# Patient Record
Sex: Female | Born: 1965 | Race: White | Hispanic: No | Smoking: Never smoker
Health system: Southern US, Community
[De-identification: ages and names within clinical notes are randomized; demographics above are authoritative.]

## PROBLEM LIST (undated history)

## (undated) DIAGNOSIS — J029 Acute pharyngitis, unspecified: Secondary | ICD-10-CM

## (undated) DIAGNOSIS — R040 Epistaxis: Secondary | ICD-10-CM

## (undated) DIAGNOSIS — R42 Dizziness and giddiness: Secondary | ICD-10-CM

## (undated) DIAGNOSIS — Z460 Encounter for fitting and adjustment of spectacles and contact lenses: Secondary | ICD-10-CM

## (undated) DIAGNOSIS — R6883 Chills (without fever): Secondary | ICD-10-CM

## (undated) DIAGNOSIS — R531 Weakness: Secondary | ICD-10-CM

## (undated) DIAGNOSIS — J349 Unspecified disorder of nose and nasal sinuses: Secondary | ICD-10-CM

## (undated) DIAGNOSIS — L309 Dermatitis, unspecified: Secondary | ICD-10-CM

## (undated) HISTORY — DX: Encounter for fitting and adjustment of spectacles and contact lenses: Z46.0

## (undated) HISTORY — PX: TOTAL THYROIDECTOMY: SHX2547

## (undated) HISTORY — DX: Weakness: R53.1

## (undated) HISTORY — DX: Acute pharyngitis, unspecified: J02.9

## (undated) HISTORY — DX: Chills (without fever): R68.83

## (undated) HISTORY — PX: FOOT SURGERY: SHX648

## (undated) HISTORY — DX: Dizziness and giddiness: R42

## (undated) HISTORY — DX: Unspecified disorder of nose and nasal sinuses: J34.9

## (undated) HISTORY — DX: Epistaxis: R04.0

## (undated) HISTORY — PX: TONSILLECTOMY AND ADENOIDECTOMY: SUR1326

## (undated) HISTORY — DX: Dermatitis, unspecified: L30.9

---

## 1999-09-29 ENCOUNTER — Encounter: Admission: RE | Admit: 1999-09-29 | Discharge: 1999-10-16 | Payer: Self-pay | Admitting: Family Medicine

## 2000-03-12 ENCOUNTER — Ambulatory Visit (HOSPITAL_BASED_OUTPATIENT_CLINIC_OR_DEPARTMENT_OTHER): Admission: RE | Admit: 2000-03-12 | Discharge: 2000-03-12 | Payer: Self-pay | Admitting: Orthopedic Surgery

## 2002-09-19 ENCOUNTER — Encounter (INDEPENDENT_AMBULATORY_CARE_PROVIDER_SITE_OTHER): Payer: Self-pay | Admitting: Specialist

## 2002-09-19 ENCOUNTER — Ambulatory Visit (HOSPITAL_BASED_OUTPATIENT_CLINIC_OR_DEPARTMENT_OTHER): Admission: RE | Admit: 2002-09-19 | Discharge: 2002-09-19 | Payer: Self-pay | Admitting: Obstetrics and Gynecology

## 2004-05-13 ENCOUNTER — Encounter: Admission: RE | Admit: 2004-05-13 | Discharge: 2004-05-13 | Payer: Self-pay | Admitting: Internal Medicine

## 2004-09-18 ENCOUNTER — Encounter: Admission: RE | Admit: 2004-09-18 | Discharge: 2004-09-18 | Payer: Self-pay | Admitting: Internal Medicine

## 2005-06-01 ENCOUNTER — Other Ambulatory Visit: Admission: RE | Admit: 2005-06-01 | Discharge: 2005-06-01 | Payer: Self-pay | Admitting: Obstetrics and Gynecology

## 2007-04-05 ENCOUNTER — Encounter: Admission: RE | Admit: 2007-04-05 | Discharge: 2007-04-05 | Payer: Self-pay | Admitting: Orthopaedic Surgery

## 2007-05-03 ENCOUNTER — Encounter: Admission: RE | Admit: 2007-05-03 | Discharge: 2007-05-03 | Payer: Self-pay | Admitting: Family Medicine

## 2007-05-03 ENCOUNTER — Encounter (INDEPENDENT_AMBULATORY_CARE_PROVIDER_SITE_OTHER): Payer: Self-pay | Admitting: Interventional Radiology

## 2007-05-03 ENCOUNTER — Other Ambulatory Visit: Admission: RE | Admit: 2007-05-03 | Discharge: 2007-05-03 | Payer: Self-pay | Admitting: Interventional Radiology

## 2008-05-03 ENCOUNTER — Inpatient Hospital Stay (HOSPITAL_COMMUNITY): Admission: AD | Admit: 2008-05-03 | Discharge: 2008-05-03 | Payer: Self-pay | Admitting: Obstetrics and Gynecology

## 2008-08-01 ENCOUNTER — Encounter: Admission: RE | Admit: 2008-08-01 | Discharge: 2008-08-01 | Payer: Self-pay | Admitting: Obstetrics and Gynecology

## 2008-08-11 ENCOUNTER — Inpatient Hospital Stay (HOSPITAL_COMMUNITY): Admission: AD | Admit: 2008-08-11 | Discharge: 2008-08-11 | Payer: Self-pay | Admitting: Obstetrics and Gynecology

## 2008-08-18 ENCOUNTER — Inpatient Hospital Stay (HOSPITAL_COMMUNITY): Admission: AD | Admit: 2008-08-18 | Discharge: 2008-08-24 | Payer: Self-pay | Admitting: Obstetrics & Gynecology

## 2008-09-01 ENCOUNTER — Inpatient Hospital Stay (HOSPITAL_COMMUNITY): Admission: AD | Admit: 2008-09-01 | Discharge: 2008-10-02 | Payer: Self-pay | Admitting: Obstetrics

## 2008-09-30 ENCOUNTER — Encounter (INDEPENDENT_AMBULATORY_CARE_PROVIDER_SITE_OTHER): Payer: Self-pay | Admitting: Obstetrics and Gynecology

## 2008-12-26 ENCOUNTER — Other Ambulatory Visit: Admission: RE | Admit: 2008-12-26 | Discharge: 2008-12-26 | Payer: Self-pay | Admitting: Interventional Radiology

## 2008-12-26 ENCOUNTER — Encounter (INDEPENDENT_AMBULATORY_CARE_PROVIDER_SITE_OTHER): Payer: Self-pay | Admitting: Interventional Radiology

## 2008-12-26 ENCOUNTER — Encounter: Admission: RE | Admit: 2008-12-26 | Discharge: 2008-12-26 | Payer: Self-pay | Admitting: Family Medicine

## 2010-01-30 ENCOUNTER — Ambulatory Visit (HOSPITAL_COMMUNITY): Admission: RE | Admit: 2010-01-30 | Discharge: 2010-01-31 | Payer: Self-pay | Admitting: Surgery

## 2010-01-30 ENCOUNTER — Encounter (INDEPENDENT_AMBULATORY_CARE_PROVIDER_SITE_OTHER): Payer: Self-pay | Admitting: Surgery

## 2010-10-05 ENCOUNTER — Encounter: Payer: Self-pay | Admitting: Internal Medicine

## 2010-12-01 LAB — CBC
HCT: 39.2 % (ref 36.0–46.0)
Hemoglobin: 13.3 g/dL (ref 12.0–15.0)
MCV: 89.5 fL (ref 78.0–100.0)
RBC: 4.38 MIL/uL (ref 3.87–5.11)
WBC: 12.6 10*3/uL — ABNORMAL HIGH (ref 4.0–10.5)

## 2010-12-01 LAB — URINALYSIS, ROUTINE W REFLEX MICROSCOPIC
Bilirubin Urine: NEGATIVE
Nitrite: NEGATIVE
Specific Gravity, Urine: 1.016 (ref 1.005–1.030)
pH: 5.5 (ref 5.0–8.0)

## 2010-12-01 LAB — URINE MICROSCOPIC-ADD ON

## 2010-12-01 LAB — BASIC METABOLIC PANEL
Chloride: 106 mEq/L (ref 96–112)
GFR calc Af Amer: >60 mL/min (ref >60)
Potassium: 4.3 mEq/L (ref 3.5–5.1)
Sodium: 144 mEq/L (ref 135–145)

## 2010-12-01 LAB — DIFFERENTIAL
Eosinophils Absolute: 0.2 10*3/uL (ref 0.0–0.7)
Eosinophils Relative: 1 % (ref 0–5)
Lymphs Abs: 5.1 10*3/uL — ABNORMAL HIGH (ref 0.7–4.0)
Monocytes Relative: 9 % (ref 3–12)

## 2010-12-01 LAB — CALCIUM
Calcium: 8.7 mg/dL (ref 8.4–10.5)
Calcium: 9 mg/dL (ref 8.4–10.5)

## 2010-12-29 LAB — GLUCOSE, CAPILLARY
Glucose-Capillary: 100 mg/dL — ABNORMAL HIGH (ref 70–99)
Glucose-Capillary: 100 mg/dL — ABNORMAL HIGH (ref 70–99)
Glucose-Capillary: 100 mg/dL — ABNORMAL HIGH (ref 70–99)
Glucose-Capillary: 104 mg/dL — ABNORMAL HIGH (ref 70–99)
Glucose-Capillary: 105 mg/dL — ABNORMAL HIGH (ref 70–99)
Glucose-Capillary: 106 mg/dL — ABNORMAL HIGH (ref 70–99)
Glucose-Capillary: 106 mg/dL — ABNORMAL HIGH (ref 70–99)
Glucose-Capillary: 109 mg/dL — ABNORMAL HIGH (ref 70–99)
Glucose-Capillary: 112 mg/dL — ABNORMAL HIGH (ref 70–99)
Glucose-Capillary: 112 mg/dL — ABNORMAL HIGH (ref 70–99)
Glucose-Capillary: 113 mg/dL — ABNORMAL HIGH (ref 70–99)
Glucose-Capillary: 121 mg/dL — ABNORMAL HIGH (ref 70–99)
Glucose-Capillary: 124 mg/dL — ABNORMAL HIGH (ref 70–99)
Glucose-Capillary: 124 mg/dL — ABNORMAL HIGH (ref 70–99)
Glucose-Capillary: 125 mg/dL — ABNORMAL HIGH (ref 70–99)
Glucose-Capillary: 131 mg/dL — ABNORMAL HIGH (ref 70–99)
Glucose-Capillary: 132 mg/dL — ABNORMAL HIGH (ref 70–99)
Glucose-Capillary: 132 mg/dL — ABNORMAL HIGH (ref 70–99)
Glucose-Capillary: 133 mg/dL — ABNORMAL HIGH (ref 70–99)
Glucose-Capillary: 134 mg/dL — ABNORMAL HIGH (ref 70–99)
Glucose-Capillary: 149 mg/dL — ABNORMAL HIGH (ref 70–99)
Glucose-Capillary: 81 mg/dL (ref 70–99)
Glucose-Capillary: 85 mg/dL (ref 70–99)
Glucose-Capillary: 86 mg/dL (ref 70–99)
Glucose-Capillary: 88 mg/dL (ref 70–99)
Glucose-Capillary: 92 mg/dL (ref 70–99)
Glucose-Capillary: 92 mg/dL (ref 70–99)
Glucose-Capillary: 92 mg/dL (ref 70–99)
Glucose-Capillary: 94 mg/dL (ref 70–99)
Glucose-Capillary: 94 mg/dL (ref 70–99)
Glucose-Capillary: 96 mg/dL (ref 70–99)
Glucose-Capillary: 97 mg/dL (ref 70–99)
Glucose-Capillary: 99 mg/dL (ref 70–99)
Glucose-Capillary: 102 mg/dL — ABNORMAL HIGH (ref 70–99)
Glucose-Capillary: 105 mg/dL — ABNORMAL HIGH (ref 70–99)
Glucose-Capillary: 93 mg/dL (ref 70–99)
Glucose-Capillary: 99 mg/dL (ref 70–99)

## 2010-12-29 LAB — CBC
HCT: 32.8 % — ABNORMAL LOW (ref 36.0–46.0)
Hemoglobin: 9 g/dL — ABNORMAL LOW (ref 12.0–15.0)
MCHC: 34.5 g/dL (ref 30.0–36.0)
MCHC: 33.4 g/dL (ref 30.0–36.0)
MCHC: 34.8 g/dL (ref 30.0–36.0)
MCV: 88.1 fL (ref 78.0–100.0)
MCV: 88.3 fL (ref 78.0–100.0)
RBC: 3.72 MIL/uL — ABNORMAL LOW (ref 3.87–5.11)
RBC: 2.91 MIL/uL — ABNORMAL LOW (ref 3.87–5.11)
RDW: 15.4 % (ref 11.5–15.5)
WBC: 9.1 10*3/uL (ref 4.0–10.5)

## 2010-12-29 LAB — TYPE AND SCREEN
ABO/RH(D): A NEG
ABO/RH(D): A NEG
Antibody Screen: POSITIVE
DAT, IgG: NEGATIVE
DAT, IgG: NEGATIVE

## 2010-12-29 LAB — DIFFERENTIAL
Eosinophils Absolute: 0.2 10*3/uL (ref 0.0–0.7)
Eosinophils Relative: 2 % (ref 0–5)
Lymphs Abs: 2.4 10*3/uL (ref 0.7–4.0)
Monocytes Absolute: 0.6 10*3/uL (ref 0.1–1.0)
Monocytes Relative: 7 % (ref 3–12)

## 2010-12-29 LAB — DIC (DISSEMINATED INTRAVASCULAR COAGULATION)PANEL
INR: 1 (ref 0.00–1.49)
Platelets: 185 10*3/uL (ref 150–400)
Prothrombin Time: 13.3 seconds (ref 11.6–15.2)

## 2011-01-27 NOTE — Discharge Summary (Signed)
[name], [name]               ACCOUNT NO.:  [id number]   MEDICAL RECORD NO.:  [id number]          PATIENT TYPE:  INP   LOCATION:  9119                          FACILITY:  [facility]   PHYSICIAN:  Maxie Better, M.D.DATE OF BIRTH:  02-Mar-1966   DATE OF ADMISSION:  09/01/2008  DATE OF DISCHARGE:  10/02/2008                               DISCHARGE SUMMARY   ADMISSION DIAGNOSES:  1. Recurrent third trimester bleeding secondary to chronic abruption.  2. Rh negative.  3. Sickle cell trait.  4. Advanced maternal age.  5. Gestational diabetes.  6. Intrauterine gestation at 59 and 4/7 weeks.   DISCHARGE DIAGNOSES:  1. Chronic abruption, recurrent third trimester vaginal bleeding,  2. Oligohydramnios.  3. Class A2 gestational diabetes.  4. Intrauterine gestation at 37-6/7 weeks.  5. Advanced maternal age.   PROCEDURE:  Primary cesarean section  Kerr hysterotomy.   HISTORY OF PRESENT ILLNESS:  This is a 45 year old gravida 4, para 1  female at 57 and 4/7 weeks' with recurrent third trimester vaginal  bleeding, readmitted on September 01, 2008, secondary to acute onset of  vaginal bleeding.  The patient has had previous prolonged  hospitalization due to her bleeding, which was thought to be  marginal/chronic abruption.  The patient had no underlying risk factor.  She developed class gestational diabetes, which ultimately required  glyburide for management.  The patient's Rh negative.  She has received  RhoGAM.   HOSPITAL COURSE:  The patient was readmitted to the hospital and she was  placed on antepartum service.  Blood sugars were monitored x4 per day.  The patient was placed on continuous monitoring.  Blood sugars were  managed with medication.  She developed premature contractions during  the hospitalization for which Procardia was given.  DIC panel was  ordered during the hospitalization as well.  The patient's examination  was 1, 50%, and -3 with some clotted blood.  She began  bleeding.  On  September 10, 2008, antiphospholipid antibody panel was done, which was  negative.  Ultrasound did not show any evidence of abruption.  Her  bleeding subsequently resolved.  The patient remained in-house.  On  September 25, 2008,  the patient requested a primary cesarean section for  the event of delivery whenever that occurs.  On September 29, 2008, the  patient began having vaginal bleeding.  She was about 1 cm long  presenting part out of pelvis, clots within the vault, fetal heart rate  of 120s,  uterine irritability.  Her last platelet count on general  attempt was 205, platelet count decreased to 186,000.  Her fibrinogen  had dropped to 1481 from a previous 659.  Given the recurrence of the  bleeding and evidence of change in her laboratory studies, decision was  made to proceed with the primary cesarean section.  Primary C-section  was performed, small scant amniotic fluid was noted.  At that time, live  female, cord around the neck x1, normal tubes and ovary, posterior  placenta with abruption, and bright red blood was noted.  Small uterine  fibroid was noted.  Placenta was sent  to Pathology.  Postoperatively,  the patient did well and desired to go home on postop day #2.  On postop  day #1, her hemoglobin was 9.0, hematocrit 25.7, platelet count 167,  000, and white count 12.6.  She remained afebrile.  Blood pressure was  normal.  Her incision had no evidence of erythema or induration.  Baby  was remained in the regular nursery.  Her blood sugars were no longer  checked.  She was deemed well to be discharged home.   DISPOSITION:  Home.   CONDITION:  Stable.   DISCHARGE MEDICATIONS:  1. Motrin 600-800 mg every 8 hours p.r.n. pain.  2. Percocet 1-2 tablets every 4-6 hours p.r.n. pain.  3. Prenatal vitamins one p.o. daily.   FOLLOWUP APPOINTMENT:  At [facility] for staple removal in the  coming week and for postpartum appointment in 6 weeks as well as a 2-  hour  glucose tolerance test in 8 weeks.   DISCHARGE INSTRUCTIONS:  Per the postpartum booklet given.      Maxie Better, M.D.  Electronically Signed     [place]/MEDQ  D:  11/04/2008  T:  11/05/2008  Job:  [id number]

## 2011-01-27 NOTE — Op Note (Signed)
[name], [name]               ACCOUNT NO.:  [id number]   MEDICAL RECORD NO.:  [id number]          PATIENT TYPE:  INP   LOCATION:  9119                          FACILITY:  [facility]   PHYSICIAN:  Maxie Better, M.D.DATE OF BIRTH:  1966/01/09   DATE OF PROCEDURE:  09/30/2008  DATE OF DISCHARGE:                               OPERATIVE REPORT   PREOPERATIVE DIAGNOSES:  Chronic placental abruption, class A2  gestational diabetes, and intrauterine gestation at 37-6/7 weeks.   POSTOPERATIVE DIAGNOSES:  Class A2 gestational diabetes, chronic  placental abruption, oligohydramnios, and intrauterine gestation at 65-  6/7 weeks.   PROCEDURES:  Primary cesarean section  Kerr hysterotomy.   ANESTHESIA:  Spinal.   SURGEON:  Maxie Better, MD   ASSISTANT:  Marlinda Mike, CNM   INDICATIONS:  A 45 year old gravida 4, para 1-0-2-1 married black female  with prenatal care complicated by class A2 gestational diabetes and  recurrent third-trimester bleeding, thought secondary to chronic  abruption, who has had prolonged hospitalization secondary to her third  episode of vaginal bleeding, who on September 30, 2008, began having  vaginal bleeding again.  The patient was betamethasone complete.  She  had a DIC panel and a CBC drawn.  Her platelets had dropped from 205 to  186, and her fibrinogen was decreased from 600s to 481.  The patient was  having some intermittent contractions, however tracing was nonreactive.  Given the findings, vaginal exam was performed; a large clot was in the  vagina, cervix was 1, long, and presenting part out of the pelvis.  Decision was made to proceed with a planned primary cesarean section.  Risks and benefits of the procedure have been explained to the patient.  Consent was signed.  The patient was transferred to the operating room.   PROCEDURE IN DETAIL:  Under adequate spinal anesthesia, the patient was  placed in the supine position with a left lateral tilt.   She was  sterilely prepped and draped in the usual fashion.  Indwelling Foley  catheter was sterilely placed.  Marcaine 0.25% was injected along the  planned Pfannenstiel skin incision site.  Pfannenstiel skin incision was  then made, carried down to the rectus fascia.  Rectus fascia was opened  transversely.  The rectus fascia was then bluntly and sharply dissected  off the rectus muscle in superior and inferior fashion.  The rectus  muscles split in midline.  The parietal peritoneum was entered sharply  and extended.  The vesicouterine peritoneum was opened transversely.  The bladder was then bluntly dissected off the lower uterine segment and  displaced with the bladder retractor inferiorly.  Curvilinear low  transverse uterine incision was then made and extended with bandage  scissors.  On entering the uterine cavity, bright red blood was  encountered with clots.  There was no amniotic fluid seen.  Subsequent  delivery of a live female was accomplished.  Cord around the neck x1 was  reducible.  Baby was bulb suctioned on the abdomen.  Cord was clamped,  cut, and the baby was transferred to the awaiting pediatrician, who  assigned Apgars of  9 and 9.  The placenta actually was posterior, it was  partially already detached, it was removed without incident, and all the  clotted material in the lower uterine segment was then removed.  Uterine  cavity was cleaned of debris.  Uterine incision had no extension, was  closed with 0 Monocryl running locked stitch, first layer, and second  layer imbricating using 0 Monocryl suture.  Small bleeding along the  peritoneal edges was cauterized.  Normal tubes and ovaries were noted  bilaterally.  Small palpable fibroid, subserosal, was noted.  There was  an intramural fibroid noted on the right.  The abdomen was then  copiously irrigated and suctioned of debris.  Good hemostasis was noted  along the incision line.  The parietal peritoneum was then  closed with 2-  0 Vicryl and the rectus fascia with 0 Vicryl x2.  The subcutaneous fat  was irrigated, small bleeders cauterized, interrupted 2-0 plain sutures  placed, and skin approximated using Ethicon staples.   SPECIMENS:  Placenta sent to Pathology.   ESTIMATED BLOOD LOSS:  700 mL.   INTRAOPERATIVE FLUID:  2500 mL.   URINE OUTPUT:  800 mL clear yellow urine.   Sponge and instrument counts x2 correct.   COMPLICATIONS:  None.   Weight of the baby was 7 pounds 8 ounces.  The patient tolerated the  procedure well and was transferred to recovery in stable condition.      Maxie Better, M.D.  Electronically Signed     [place]/MEDQ  D:  09/30/2008  T:  09/30/2008  Job:  [id number]

## 2011-01-27 NOTE — Discharge Summary (Signed)
[name], [name]               ACCOUNT NO.:  [id number]   MEDICAL RECORD NO.:  [id number]          PATIENT TYPE:  INP   LOCATION:  9151                          [facility]ACILITY:  [facility]   PHYSICIAN:  Maxie Better, M.D.DATE OF BIRTH:  01/27/1966   DATE OF ADMISSION:  08/18/2008  DATE OF DISCHARGE:  08/24/2008                               DISCHARGE SUMMARY   ADMISSION DIAGNOSES:  Third trimester vaginal bleeding, presumed  marginal abruption, class A1 gestational diabetes, intrauterine  gestation at 31-5/7th weeks, advanced maternal age, and Rh negative.   DISCHARGE DIAGNOSES:  Presumed marginal placental abruption, class A1  gestational diabetes, and intrauterine gestation at 32-4/7th weeks  undelivered.   HISTORY OF PRESENT ILLNESS:  A 45 year old gravida 4, para 1-0-2-1  female at 31-5/7th weeks admitted to [facility] for third trimester  vaginal bleeding.  The patient was unsure that she had rupture of  membranes.  She had noted some abdominal cramping.  Fetal heart rate was  positive.   HOSPITAL COURSE:  The patient was initially evaluated in Maternity  Admissions.  This is a third episode of the vaginal bleeding during this  pregnancy.  The patient underwent ultrasound that showed normal anterior  placenta.  Cervical length at 3.7.  Estimated fetal weight of 2169 g.  No evidence of an abruption.  She had been given RhoGAM at 28 weeks on  November 16.  Her group B strep status was unknown due to her  gestational age.  A nonstress test showed a fetal heart rate baseline of  130s, good reactivity and uterine irritability.  Her cervix was  fingertip, about 50% presenting part of the pelvis showed a large clot  in the vagina.  The patient had NICU consultations, betamethasone, and  monitoring of her blood sugars during her admission.  The patient had  continued daily to monitoring.  Her blood sugars did not require any  additional medications.  She had a DIC panel that showed  an increase in  her D-dimer, but otherwise had no further recurrence of her bleeding.  The patient desire to be discharged home and was discharged home on  delivery.   DISPOSITION:  Home.   CONDITION:  Stable.   DISCHARGE MEDICATIONS:  Prenatal vitamins 1 p.o. daily   DISCHARGE INSTRUCTIONS:  Preterm labor precautions, vaginal bleeding  precautions, daily kick counts, and abstain from intercourse.   FOLLOWUP APPOINTMENT:  In 1 week's time.      Maxie Better, M.D.  Electronically Signed    [place]/MEDQ  D:  09/30/2008  T:  09/30/2008  Job:  [id number]

## 2011-01-30 NOTE — Op Note (Signed)
[name], [name]                           ACCOUNT NO.:  [id number]   MEDICAL RECORD NO.:  [id number]                   PATIENT TYPE:  AMB   LOCATION:  NESC                                 FACILITY:  [facility]   PHYSICIAN:  Laqueta Linden, M.D.                 DATE OF BIRTH:  07/21/1966   DATE OF PROCEDURE:  09/19/2002  DATE OF DISCHARGE:                                 OPERATIVE REPORT   PREOPERATIVE DIAGNOSIS:  Long term breakthrough bleeding due to endometrial  polyp.   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   OPERATION PERFORMED:  Hysteroscopic resection with curettage.   SURGEON:  Laqueta Linden, M.D.   ANESTHESIA:  MAC sedation with paracervical block.   ESTIMATED BLOOD LOSS:  Less than 20 cc.   SORBITOL NET INTAKE:  0.   COMPLICATIONS:  None.   INDICATIONS FOR PROCEDURE:  The patient is a 45 year old gravida 2 para 1  female who has had persistent breakthrough bleeding since July of 2003.  She  has been alternating generic Loestrin 1.5/30 with 1/20 and taking two pills  a day for the past four months without any improvement in her bleeding.  She  was noted on pelvic examination to have an enlarged slightly irregular  uterus consistent with fibroids.  She underwent pelvic ultrasound with  sonohystogram which confirmed a fibroid uterus with normal-appearing  ovaries.  Sonohystogram revealed 9 x 4 x 10 mm endometrial polyp in the  fundus.  It was felt that the polyp was the likely source of the break  through bleeding.  She saw the informed consent film, was informed of the  risks, benefits, alternatives and complications of the procedure as well as  the increased risk of DVT due to high dose multiple pill use and agrees to  proceed.  Full consent was given.   DESCRIPTION OF PROCEDURE:  The patient was taken to the operating room and  after proper identification and consents were ascertained, she was placed on  the   operating table in supine position.  After  mask sedation was  accomplished, she was placed in the [place] stirrups.  The perineum and vagina  were prepped and draped in routine sterile fashion.  The bladder was  catheterized with a Foley throughout the procedure which was removed at the  conclusion of the procedure.  Bimanual examination confirmed an anterior  slightly enlarged irregular uterus.  A speculum was placed in the vagina.  The uterine cavity sounded to 9 cm.  The internal os was gently dilated to a  #33 Pratt dilator.  The resectoscope with continuous Sorbitol infusion was  then inserted under direct vision using the video.  The endocervical canal  was free of lesions.  The posterior fundus had a large broad based polyp.  The cornua were visualized with no other lesions noted.  The resectoscope  was placed on routine settings and the polyp was then resected in multiple  pieces which were sent separately to pathology.  The base of the polyp was  cauterized.  Hemostasis was excellent.  There were no other uterine lesions  identified.  The scope was then withdrawn.  The remainder of the endometrial  cavity was sharply curetted with a scant amount of additional tissue  obtained.  This was sent separately.  All instruments were then removed.  There was no active bleeding from the tenaculum site.  There was a slight  amount of bleeding from the cervix.  Estimated blood loss was less than 20  cc.  Sorbitol intake was 0.  The Foley catheter was removed.  The patient  received Toradol 30 mg IV 30 mg IV prior to the conclusion of the procedure.  She was awakened stable and transferred to the recovery room.  She will be  observed and discharged per anesthesia protocol.  She is to follow up at the  office in three to four weeks' time.  She is to decrease her birth control  pills to twice a day for one week, then once daily until her follow-up visit  and to use Advil as needed for any cramping.   She is to follow up in the  office as scheduled but to call sooner for excessive pain, fever, bleeding,  nausea, vomiting or any other concerns.                                                Laqueta Linden, M.D.    [facility]/MEDQ  D:  09/19/2002  T:  09/19/2002  Job:  [id number]

## 2011-01-30 NOTE — Op Note (Signed)
[place]  Patient:    [name], [name]                        MRN: [id number] Proc. Date: 03/12/00 Adm. Date:  [id number] Attending:  Aldean Baker V                           Operative Report  PREOPERATIVE DIAGNOSIS:  Hallux valgus moderate left great toe bunion.  POSTOPERATIVE DIAGNOSIS:  Hallux valgus moderate left great toe bunion.  OPERATION:  Chevron osteotomy left first metatarsal.  SURGEON:  Nadara Mustard, M.D.  ASSISTANT:  Pollyann Savoy, P.A.S.  ANESTHESIA:  Ankle block.  ESTIMATED BLOOD LOSS:  Minimal.  ANTIBIOTICS:  Kefzol 1 gm.  DRAINS:  None.  COMPLICATIONS:  None.  DISPOSITION:  To the PACU in stable condition.  INDICATION:  The patient is a 45 year old woman who has had a longstanding history of pain and callus formation over her first metatarsal head.  She has tried conservative care with anti-inflammatories and shoe modification without relief.  Her intermetatarsal angle is 11 degrees with a hallux valgus angle of 20 degrees.  She presents at this time for Chevron osteotomy.  The risks and benefits were discussed including infection, neurovascular injury, pain, fracture of the bone, stiffness of the toe.  The patient states he understands and wishes to proceed at this time.  DESCRIPTION OF PROCEDURE:  The patient is brought to outpatient room 7 after undergoing an angle block.  After adequate level of anesthesia obtained, the patients left lower extremity was prepped using DuraPrep and draped in a sterile field.  [name] was used to cover all exposed skin.  A longitudinal medial incision was made centered over the MTP joint.  This was carried down to the capsule retinaculum and an elliptical aspect of the inferior aspect of the capsule was excised to relocate the sesamoid.  An osteotomy was performed over the lateral prominence.  A Chevron osteotomy was performed.  The distal head was then translated laterally  approximately 3 to 4 mm.  This was then stabilized with a 6/2 K-wire.  The wound was irrigated with normal saline.  The retinaculum was then reapproximated using 2-0 Ethibond.  The incision was closed using an interrupted vertical mattress.  The pin was cut just above the skin and was covered in Bactroban.  The wound was covered with Adaptic, orthopedic sponges, sterile Webril, cling, and a loosely wrapped Coban.  The patient was taken to the PACU in stable condition.  She will be nonweightbearing for two weeks. Follow up in the office in two weeks.  Crutches and a wooden shoe. DD:  03/12/00 TD:  03/13/00 Job: [id number]

## 2011-06-16 LAB — URINALYSIS, ROUTINE W REFLEX MICROSCOPIC
Bilirubin Urine: NEGATIVE
Ketones, ur: NEGATIVE
Leukocytes, UA: NEGATIVE
Nitrite: NEGATIVE
Protein, ur: NEGATIVE
Urobilinogen, UA: 0.2
pH: 5.5

## 2011-06-16 LAB — URINE MICROSCOPIC-ADD ON

## 2011-06-19 LAB — TYPE AND SCREEN
ABO/RH(D): A NEG
ABO/RH(D): A NEG
DAT, IgG: NEGATIVE
DAT, IgG: NEGATIVE
DAT, IgG: NEGATIVE
DAT, IgG: NEGATIVE
DAT, IgG: NEGATIVE

## 2011-06-19 LAB — GLUCOSE, CAPILLARY
Glucose-Capillary: 102 mg/dL — ABNORMAL HIGH (ref 70–99)
Glucose-Capillary: 102 mg/dL — ABNORMAL HIGH (ref 70–99)
Glucose-Capillary: 104 mg/dL — ABNORMAL HIGH (ref 70–99)
Glucose-Capillary: 107 mg/dL — ABNORMAL HIGH (ref 70–99)
Glucose-Capillary: 108 mg/dL — ABNORMAL HIGH (ref 70–99)
Glucose-Capillary: 111 mg/dL — ABNORMAL HIGH (ref 70–99)
Glucose-Capillary: 111 mg/dL — ABNORMAL HIGH (ref 70–99)
Glucose-Capillary: 112 mg/dL — ABNORMAL HIGH (ref 70–99)
Glucose-Capillary: 113 mg/dL — ABNORMAL HIGH (ref 70–99)
Glucose-Capillary: 114 mg/dL — ABNORMAL HIGH (ref 70–99)
Glucose-Capillary: 115 mg/dL — ABNORMAL HIGH (ref 70–99)
Glucose-Capillary: 115 mg/dL — ABNORMAL HIGH (ref 70–99)
Glucose-Capillary: 115 mg/dL — ABNORMAL HIGH (ref 70–99)
Glucose-Capillary: 117 mg/dL — ABNORMAL HIGH (ref 70–99)
Glucose-Capillary: 120 mg/dL — ABNORMAL HIGH (ref 70–99)
Glucose-Capillary: 120 mg/dL — ABNORMAL HIGH (ref 70–99)
Glucose-Capillary: 121 mg/dL — ABNORMAL HIGH (ref 70–99)
Glucose-Capillary: 121 mg/dL — ABNORMAL HIGH (ref 70–99)
Glucose-Capillary: 122 mg/dL — ABNORMAL HIGH (ref 70–99)
Glucose-Capillary: 123 mg/dL — ABNORMAL HIGH (ref 70–99)
Glucose-Capillary: 127 mg/dL — ABNORMAL HIGH (ref 70–99)
Glucose-Capillary: 128 mg/dL — ABNORMAL HIGH (ref 70–99)
Glucose-Capillary: 132 mg/dL — ABNORMAL HIGH (ref 70–99)
Glucose-Capillary: 137 mg/dL — ABNORMAL HIGH (ref 70–99)
Glucose-Capillary: 164 mg/dL — ABNORMAL HIGH (ref 70–99)
Glucose-Capillary: 81 mg/dL (ref 70–99)
Glucose-Capillary: 89 mg/dL (ref 70–99)
Glucose-Capillary: 90 mg/dL (ref 70–99)
Glucose-Capillary: 90 mg/dL (ref 70–99)
Glucose-Capillary: 91 mg/dL (ref 70–99)
Glucose-Capillary: 91 mg/dL (ref 70–99)
Glucose-Capillary: 92 mg/dL (ref 70–99)
Glucose-Capillary: 96 mg/dL (ref 70–99)
Glucose-Capillary: 96 mg/dL (ref 70–99)
Glucose-Capillary: 97 mg/dL (ref 70–99)
Glucose-Capillary: 99 mg/dL (ref 70–99)

## 2011-06-19 LAB — KLEIHAUER-BETKE STAIN
Fetal Cells %: 0 %
Quantitation Fetal Hemoglobin: 0 mL

## 2011-06-19 LAB — STREP B DNA PROBE: Strep Group B Ag: NEGATIVE

## 2011-06-19 LAB — CBC
HCT: 30.6 % — ABNORMAL LOW (ref 36.0–46.0)
HCT: 33.1 % — ABNORMAL LOW (ref 36.0–46.0)
Hemoglobin: 10.9 g/dL — ABNORMAL LOW (ref 12.0–15.0)
MCHC: 35 g/dL (ref 30.0–36.0)
MCV: 86.7 fL (ref 78.0–100.0)
Platelets: 200 10*3/uL (ref 150–400)
Platelets: 214 10*3/uL (ref 150–400)
RBC: 3.77 MIL/uL — ABNORMAL LOW (ref 3.87–5.11)
RDW: 14.6 % (ref 11.5–15.5)
WBC: 8.9 10*3/uL (ref 4.0–10.5)

## 2011-06-19 LAB — CROSSMATCH: DAT, IgG: NEGATIVE

## 2011-06-19 LAB — SAMPLE TO BLOOD BANK

## 2011-06-19 LAB — ANTIPHOSPHOLIPID SYNDROME EVAL, BLD
Anticardiolipin IgA: <7 [APL'U] — ABNORMAL LOW (ref <13)
Anticardiolipin IgM: <7 [MPL'U] — ABNORMAL LOW (ref <10)
Antiphosphatidylserine IgA: <20 APS U/mL (ref <20.0)
Antiphosphatidylserine IgG: <11 GPS U/mL (ref <11.0)
Antiphosphatidylserine IgM: <25 MPS U/mL (ref <25.0)
PTT Lupus Anticoagulant: 44 secs (ref 36.3–48.8)

## 2011-06-19 LAB — FIBRINOGEN: Fibrinogen: 562 mg/dL — ABNORMAL HIGH (ref 204–475)

## 2011-06-19 LAB — DIC (DISSEMINATED INTRAVASCULAR COAGULATION)PANEL
D-Dimer, Quant: 8.37 ug/mL-FEU — ABNORMAL HIGH (ref 0.00–0.48)
D-Dimer, Quant: 8.5 ug/mL-FEU — ABNORMAL HIGH (ref 0.00–0.48)
Platelets: 200 10*3/uL (ref 150–400)
Prothrombin Time: 13.9 seconds (ref 11.6–15.2)
Smear Review: NONE SEEN

## 2011-06-19 LAB — RHOGAM INJECTION

## 2011-06-19 LAB — RPR: RPR Ser Ql: NONREACTIVE

## 2011-06-19 LAB — APTT: aPTT: 30 seconds (ref 24–37)

## 2011-06-19 LAB — FETAL FIBRONECTIN: Fetal Fibronectin: POSITIVE

## 2011-08-25 ENCOUNTER — Other Ambulatory Visit (INDEPENDENT_AMBULATORY_CARE_PROVIDER_SITE_OTHER): Payer: Self-pay | Admitting: Surgery

## 2011-08-26 NOTE — Telephone Encounter (Signed)
[name] last office was visit 04/09/2010 for final post op. Synthroid - TSH on 05/29/2010 was 1.567 uIU/mL ( form old chart).

## 2011-10-19 ENCOUNTER — Telehealth (INDEPENDENT_AMBULATORY_CARE_PROVIDER_SITE_OTHER): Payer: Self-pay | Admitting: Surgery

## 2011-10-19 ENCOUNTER — Other Ambulatory Visit (INDEPENDENT_AMBULATORY_CARE_PROVIDER_SITE_OTHER): Payer: Self-pay | Admitting: Surgery

## 2011-10-19 NOTE — Telephone Encounter (Signed)
pls call pt if she needs lab work...Marland Kitchenhas est thyroid apt in March

## 2011-10-21 ENCOUNTER — Telehealth (INDEPENDENT_AMBULATORY_CARE_PROVIDER_SITE_OTHER): Payer: Self-pay

## 2011-10-21 ENCOUNTER — Other Ambulatory Visit (INDEPENDENT_AMBULATORY_CARE_PROVIDER_SITE_OTHER): Payer: Self-pay | Admitting: Surgery

## 2011-10-21 DIAGNOSIS — Z9889 Other specified postprocedural states: Secondary | ICD-10-CM

## 2011-10-21 DIAGNOSIS — E890 Postprocedural hypothyroidism: Secondary | ICD-10-CM

## 2011-10-21 NOTE — Telephone Encounter (Signed)
Patient stated she had an appointment with Dr. Sharl Ma today 10/21/2011.  She had labs and will have medicine refilled by Dr. Sharl Ma.

## 2011-11-26 ENCOUNTER — Encounter (INDEPENDENT_AMBULATORY_CARE_PROVIDER_SITE_OTHER): Payer: Self-pay | Admitting: Surgery

## 2011-11-30 ENCOUNTER — Encounter (INDEPENDENT_AMBULATORY_CARE_PROVIDER_SITE_OTHER): Payer: Self-pay | Admitting: Surgery

## 2016-10-09 ENCOUNTER — Other Ambulatory Visit: Payer: Self-pay | Admitting: Obstetrics and Gynecology

## 2016-10-09 DIAGNOSIS — R928 Other abnormal and inconclusive findings on diagnostic imaging of breast: Secondary | ICD-10-CM

## 2016-10-14 ENCOUNTER — Ambulatory Visit
Admission: RE | Admit: 2016-10-14 | Discharge: 2016-10-14 | Disposition: A | Discharge status: Home / Self Care | Payer: BC Managed Care – PPO | Source: Ambulatory Visit | Attending: Obstetrics and Gynecology | Admitting: Obstetrics and Gynecology

## 2016-10-14 DIAGNOSIS — R928 Other abnormal and inconclusive findings on diagnostic imaging of breast: Secondary | ICD-10-CM

## 2017-08-12 ENCOUNTER — Other Ambulatory Visit: Payer: Self-pay | Admitting: Orthopedic Surgery

## 2017-08-12 DIAGNOSIS — M25562 Pain in left knee: Secondary | ICD-10-CM

## 2017-09-22 ENCOUNTER — Other Ambulatory Visit: Payer: Self-pay | Admitting: Obstetrics and Gynecology

## 2017-09-22 DIAGNOSIS — N6320 Unspecified lump in the left breast, unspecified quadrant: Secondary | ICD-10-CM

## 2017-10-07 ENCOUNTER — Other Ambulatory Visit: Payer: BC Managed Care – PPO

## 2017-10-08 ENCOUNTER — Ambulatory Visit
Admission: RE | Admit: 2017-10-08 | Discharge: 2017-10-08 | Disposition: A | Discharge status: Home / Self Care | Payer: BC Managed Care – PPO | Source: Ambulatory Visit | Attending: Obstetrics and Gynecology | Admitting: Obstetrics and Gynecology

## 2017-10-08 DIAGNOSIS — N6320 Unspecified lump in the left breast, unspecified quadrant: Secondary | ICD-10-CM

## 2018-08-24 ENCOUNTER — Ambulatory Visit: Payer: Self-pay | Admitting: Emergency Medicine

## 2018-08-24 ENCOUNTER — Encounter

## 2019-04-05 ENCOUNTER — Other Ambulatory Visit: Payer: Self-pay

## 2019-04-05 ENCOUNTER — Ambulatory Visit (INDEPENDENT_AMBULATORY_CARE_PROVIDER_SITE_OTHER): Payer: BC Managed Care – PPO | Admitting: Family Medicine

## 2019-04-05 VITALS — BP 109/59 | HR 59 | Temp 98.4°F | Ht 66.5 in | Wt 181.8 lb

## 2019-04-05 DIAGNOSIS — J4520 Mild intermittent asthma, uncomplicated: Secondary | ICD-10-CM

## 2019-04-05 DIAGNOSIS — E038 Other specified hypothyroidism: Secondary | ICD-10-CM

## 2019-04-05 MED ORDER — ALBUTEROL SULFATE HFA 108 (90 BASE) MCG/ACT IN AERS: 2.0000 | INHALATION_SPRAY | Freq: Four times a day (QID) | RESPIRATORY_TRACT | 5 refills | Status: DC | PRN

## 2019-04-05 MED ORDER — LEVOTHYROXINE SODIUM 137 MCG PO TABS: 137.0000 ug | ORAL_TABLET | Freq: Every day | ORAL | 3 refills | Status: AC

## 2019-04-05 MED ORDER — DULERA 100-5 MCG/ACT IN AERO: 2.0000 | INHALATION_SPRAY | Freq: Two times a day (BID) | RESPIRATORY_TRACT | 5 refills | Status: DC

## 2019-04-05 NOTE — Progress Notes (Signed)
Acute Office Visit  Subjective:    Patient ID: [name], female    DOB: [birth year], 53 y.o.   MRN: [id number]  Chief Complaint  Patient presents with  . Hypothyroidism    med refill  . Asthma    med refill   Patient is in today for asthma, Hypothyroid-needs refills on medication-current stable  Hypothyroid-goiter since 53yo-pregnant increased size-entire thyroid removed- replacement since surgery  last dose change-greater than 1 year  Asthma-proair inhaler prn, dulera 100mg  -no current medication. No SOB/NO CP/NO congestion, +cough  Mammogram-1/20-normal  Past Medical History:  Diagnosis Date  . Asthma   . Bleeding nose   . Chills   . Contact lens/glasses fitting   . Dizziness   . Eczema   . Hemorrhoids   . Sinus problem   . Sore throat   . Weakness     Past Surgical History:  Procedure Laterality Date  . CESAREAN SECTION  2010  . FOOT SURGERY    . TONSILLECTOMY AND ADENOIDECTOMY    . TOTAL THYROIDECTOMY      Family History  Problem Relation Age of Onset  . Diabetes Mother   . Heart disease Mother   . Kidney failure Mother   . Diabetes Brother     Social History   Socioeconomic History  . Marital status: Single    Spouse name: Not on file  . Number of children: Not on file  . Years of education: Not on file  . Highest education level: Not on file  Occupational History  . Not on file  Social Needs  . Financial resource strain: Not on file  . Food insecurity    Worry: Not on file    Inability: Not on file  . Transportation needs    Medical: Not on file    Non-medical: Not on file  Tobacco Use  . Smoking status: Never Smoker  Substance and Sexual Activity  . Alcohol use: No  . Drug use: No  . Sexual activity: Not on file  Lifestyle  . Physical activity    Days per week: Not on file    Minutes per session: Not on file  . Stress: Not on file  Relationships  . Social Musicianconnections    Talks on phone: Not on file    Gets together: Not  on file    Attends religious service: Not on file    Active member of club or organization: Not on file    Attends meetings of clubs or organizations: Not on file    Relationship status: Not on file  . Intimate partner violence    Fear of current or ex partner: Not on file    Emotionally abused: Not on file    Physically abused: Not on file    Forced sexual activity: Not on file  Other Topics Concern  . Not on file  Social History Narrative  . Not on file    Outpatient Medications Prior to Visit  Medication Sig Dispense Refill  . levothyroxine (SYNTHROID) 137 MCG tablet Take 137 mcg by mouth daily before breakfast.    . CALCIUM PO Take by mouth daily.    . Cyanocobalamin (B-12 PO) Take by mouth daily.    . IRON PO Take by mouth daily.    . Multiple Vitamin (MULTIVITAMIN) tablet Take 1 tablet by mouth daily.     No facility-administered medications prior to visit.     No Known Allergies  Review of Systems  Constitutional: Negative  for fever.  Respiratory: Positive for cough. Negative for sputum production, shortness of breath and wheezing.   Cardiovascular: Negative for chest pain.       Objective:    Physical Exam  Constitutional: She appears well-developed and well-nourished.  HENT:  Head: Normocephalic and atraumatic.  Right Ear: External ear normal.  Left Ear: External ear normal.  Eyes: Conjunctivae are normal.  Cardiovascular: Normal rate and regular rhythm.  Pulmonary/Chest: Effort normal and breath sounds normal.    BP (!) 109/59 (BP Location: Left Arm, Patient Position: Sitting, Cuff Size: Normal)   Pulse (!) 59   Temp 98.4 F (36.9 C) (Oral)   Ht 5' 6.5" (1.689 m)   Wt 181 lb 12.8 oz (82.5 kg)   SpO2 100%   BMI 28.90 kg/m  Wt Readings from Last 3 Encounters:  04/05/19 181 lb 12.8 oz (82.5 kg)    Health Maintenance Due  Topic Date Due  . HIV Screening  06/04/1981  . PAP SMEAR-Modifier  06/05/1987  . COLONOSCOPY  06/04/2016     Lab Results   Component Value Date   WBC 12.6 (H) 01/28/2010   HGB 13.3 01/28/2010   HCT 39.2 01/28/2010   MCV 89.5 01/28/2010   PLT 256 01/28/2010   Lab Results  Component Value Date   NA 144 01/28/2010   K 4.3 01/28/2010   CO2 31 01/28/2010   GLUCOSE 81 01/28/2010   BUN 15 01/28/2010   CREATININE 0.90 01/28/2010   CALCIUM 8.7 01/31/2010     Assessment & Plan:  1. Other specified hypothyroidism Synthroid-rx-h/o goiter with removal - TSH 2. Mild intermittent asthma without complication stable Refill proair-rx Refill dulera-rx LISA Hannah Beat, MD

## 2019-04-05 NOTE — Patient Instructions (Signed)
Will call on results of labwork Call if dose on dulera is not holding symptoms-ie. Use of albuterol increasing For SOB, excessive wheezing or other concerns, ER for evaluation

## 2019-04-06 ENCOUNTER — Encounter: Payer: Self-pay | Admitting: Family Medicine

## 2019-04-06 ENCOUNTER — Other Ambulatory Visit: Payer: Self-pay | Admitting: Family Medicine

## 2019-04-06 LAB — TSH: TSH: 0.862 u[IU]/mL (ref 0.450–4.500)

## 2019-04-10 ENCOUNTER — Ambulatory Visit: Payer: Self-pay | Admitting: Registered Nurse

## 2019-04-12 ENCOUNTER — Other Ambulatory Visit: Payer: Self-pay | Admitting: Family Medicine

## 2019-04-12 MED ORDER — FLUTICASONE-SALMETEROL 250-50 MCG/DOSE IN AEPB: 1.0000 | INHALATION_SPRAY | Freq: Two times a day (BID) | RESPIRATORY_TRACT | 3 refills | Status: DC

## 2019-04-12 NOTE — Progress Notes (Signed)
Changed pt to advair 250/50 -1 puff twice a day-insurance would not cover dulera

## 2020-03-27 ENCOUNTER — Other Ambulatory Visit: Payer: Self-pay | Admitting: Family Medicine

## 2021-02-22 ENCOUNTER — Ambulatory Visit (HOSPITAL_COMMUNITY)
Admission: EM | Admit: 2021-02-22 | Discharge: 2021-02-23 | Disposition: A | Discharge status: Home / Self Care | Payer: BLUE CROSS/BLUE SHIELD | Attending: Orthopedic Surgery | Admitting: Orthopedic Surgery

## 2021-02-22 ENCOUNTER — Emergency Department (HOSPITAL_COMMUNITY): Payer: BLUE CROSS/BLUE SHIELD

## 2021-02-22 ENCOUNTER — Ambulatory Visit (HOSPITAL_COMMUNITY): Payer: BLUE CROSS/BLUE SHIELD

## 2021-02-22 ENCOUNTER — Emergency Department (HOSPITAL_COMMUNITY): Payer: BLUE CROSS/BLUE SHIELD | Admitting: Certified Registered Nurse Anesthetist

## 2021-02-22 ENCOUNTER — Encounter (HOSPITAL_COMMUNITY): Payer: Self-pay | Admitting: Emergency Medicine

## 2021-02-22 ENCOUNTER — Encounter (HOSPITAL_COMMUNITY)
Admission: EM | Disposition: A | Discharge status: Home / Self Care | Payer: Self-pay | Source: Home / Self Care | Attending: Emergency Medicine

## 2021-02-22 DIAGNOSIS — E039 Hypothyroidism, unspecified: Secondary | ICD-10-CM | POA: Diagnosis not present

## 2021-02-22 DIAGNOSIS — W010XXA Fall on same level from slipping, tripping and stumbling without subsequent striking against object, initial encounter: Secondary | ICD-10-CM | POA: Insufficient documentation

## 2021-02-22 DIAGNOSIS — R52 Pain, unspecified: Secondary | ICD-10-CM

## 2021-02-22 DIAGNOSIS — S5292XB Unspecified fracture of left forearm, initial encounter for open fracture type I or II: Secondary | ICD-10-CM | POA: Diagnosis present

## 2021-02-22 DIAGNOSIS — Z20822 Contact with and (suspected) exposure to covid-19: Secondary | ICD-10-CM | POA: Diagnosis not present

## 2021-02-22 DIAGNOSIS — Z79890 Hormone replacement therapy (postmenopausal): Secondary | ICD-10-CM | POA: Insufficient documentation

## 2021-02-22 DIAGNOSIS — S52372B Galeazzi's fracture of left radius, initial encounter for open fracture type I or II: Secondary | ICD-10-CM | POA: Insufficient documentation

## 2021-02-22 DIAGNOSIS — Z23 Encounter for immunization: Secondary | ICD-10-CM | POA: Diagnosis not present

## 2021-02-22 DIAGNOSIS — T148XXA Other injury of unspecified body region, initial encounter: Secondary | ICD-10-CM

## 2021-02-22 DIAGNOSIS — S52602B Unspecified fracture of lower end of left ulna, initial encounter for open fracture type I or II: Secondary | ICD-10-CM | POA: Diagnosis not present

## 2021-02-22 DIAGNOSIS — Z419 Encounter for procedure for purposes other than remedying health state, unspecified: Secondary | ICD-10-CM

## 2021-02-22 HISTORY — PX: OPEN REDUCTION INTERNAL FIXATION (ORIF) DISTAL RADIAL FRACTURE: SHX5989

## 2021-02-22 LAB — CBC
HCT: 38.7 % (ref 36.0–46.0)
Hemoglobin: 12.8 g/dL (ref 12.0–15.0)
MCH: 29.2 pg (ref 26.0–34.0)
MCHC: 33.1 g/dL (ref 30.0–36.0)
MCV: 88.4 fL (ref 80.0–100.0)
Platelets: 200 10*3/uL (ref 150–400)
RBC: 4.38 MIL/uL (ref 3.87–5.11)
RDW: 13.7 % (ref 11.5–15.5)
WBC: 4.9 10*3/uL (ref 4.0–10.5)
nRBC: 0 % (ref 0.0–0.2)

## 2021-02-22 LAB — BASIC METABOLIC PANEL
Anion gap: 5 (ref 5–15)
BUN: 11 mg/dL (ref 6–20)
CO2: 25 mmol/L (ref 22–32)
Calcium: 9.1 mg/dL (ref 8.9–10.3)
Chloride: 109 mmol/L (ref 98–111)
Creatinine, Ser: 0.89 mg/dL (ref 0.44–1.00)
GFR, Estimated: >60 mL/min (ref >60)
Glucose, Bld: 136 mg/dL — ABNORMAL HIGH (ref 70–99)
Potassium: 3.9 mmol/L (ref 3.5–5.1)
Sodium: 139 mmol/L (ref 135–145)

## 2021-02-22 LAB — POCT PREGNANCY, URINE: Preg Test, Ur: NEGATIVE

## 2021-02-22 LAB — RESP PANEL BY RT-PCR (FLU A&B, COVID) ARPGX2
Influenza A by PCR: NEGATIVE
Influenza B by PCR: NEGATIVE
SARS Coronavirus 2 by RT PCR: NEGATIVE

## 2021-02-22 SURGERY — OPEN REDUCTION INTERNAL FIXATION (ORIF) DISTAL RADIUS FRACTURE
Anesthesia: Regional | Site: Arm Lower | Laterality: Left

## 2021-02-22 MED ORDER — FENTANYL CITRATE (PF) 250 MCG/5ML IJ SOLN
INTRAMUSCULAR | Status: DC | PRN
  Administered 2021-02-22 (×2): 25 ug via INTRAVENOUS
  Administered 2021-02-22: 50 ug via INTRAVENOUS

## 2021-02-22 MED ORDER — LEVOTHYROXINE SODIUM 137 MCG PO TABS
137.0000 ug | ORAL_TABLET | Freq: Every day | ORAL | Status: DC
  Administered 2021-02-23: 137 ug via ORAL
  Filled 2021-02-22: qty 1

## 2021-02-22 MED ORDER — ONDANSETRON HCL 4 MG PO TABS: 4.0000 mg | ORAL_TABLET | Freq: Four times a day (QID) | ORAL | Status: DC | PRN

## 2021-02-22 MED ORDER — CEFAZOLIN SODIUM-DEXTROSE 2-4 GM/100ML-% IV SOLN
INTRAVENOUS | Status: AC
  Administered 2021-02-22: 2 g via INTRAVENOUS
  Filled 2021-02-22: qty 100

## 2021-02-22 MED ORDER — VANCOMYCIN HCL 1000 MG IV SOLR
INTRAVENOUS | Status: AC
  Filled 2021-02-22: qty 1000

## 2021-02-22 MED ORDER — ACETAMINOPHEN 500 MG PO TABS: 1000.0000 mg | ORAL_TABLET | Freq: Once | ORAL | Status: AC

## 2021-02-22 MED ORDER — ACETAMINOPHEN 500 MG PO TABS: 500.0000 mg | ORAL_TABLET | Freq: Three times a day (TID) | ORAL | 0 refills | Status: AC

## 2021-02-22 MED ORDER — ONDANSETRON HCL 4 MG/2ML IJ SOLN: 4.0000 mg | Freq: Four times a day (QID) | INTRAMUSCULAR | Status: DC | PRN

## 2021-02-22 MED ORDER — ACETAMINOPHEN 325 MG PO TABS
325.0000 mg | ORAL_TABLET | Freq: Four times a day (QID) | ORAL | Status: DC | PRN
Start: 2021-02-23 — End: 2021-02-23

## 2021-02-22 MED ORDER — MEPERIDINE HCL 25 MG/ML IJ SOLN: 6.2500 mg | INTRAMUSCULAR | Status: DC | PRN

## 2021-02-22 MED ORDER — PROPOFOL 10 MG/ML IV BOLUS
INTRAVENOUS | Status: AC
  Filled 2021-02-22: qty 20

## 2021-02-22 MED ORDER — HYDROCODONE-ACETAMINOPHEN 5-325 MG PO TABS
1.0000 | ORAL_TABLET | Freq: Four times a day (QID) | ORAL | Status: DC | PRN
  Filled 2021-02-22: qty 2

## 2021-02-22 MED ORDER — DOCUSATE SODIUM 100 MG PO CAPS: 100.0000 mg | ORAL_CAPSULE | Freq: Two times a day (BID) | ORAL | 0 refills | Status: AC

## 2021-02-22 MED ORDER — MIDAZOLAM HCL 2 MG/2ML IJ SOLN: 0.5000 mg | Freq: Once | INTRAMUSCULAR | Status: DC | PRN

## 2021-02-22 MED ORDER — MIDAZOLAM HCL 2 MG/2ML IJ SOLN
INTRAMUSCULAR | Status: AC
  Filled 2021-02-22: qty 2

## 2021-02-22 MED ORDER — METHOCARBAMOL 500 MG PO TABS
500.0000 mg | ORAL_TABLET | Freq: Four times a day (QID) | ORAL | Status: DC | PRN
  Filled 2021-02-22: qty 1

## 2021-02-22 MED ORDER — MORPHINE SULFATE (PF) 2 MG/ML IV SOLN: 0.5000 mg | INTRAVENOUS | Status: DC | PRN

## 2021-02-22 MED ORDER — FENTANYL CITRATE (PF) 100 MCG/2ML IJ SOLN
INTRAMUSCULAR | Status: AC
  Filled 2021-02-22: qty 2

## 2021-02-22 MED ORDER — DOCUSATE SODIUM 100 MG PO CAPS
100.0000 mg | ORAL_CAPSULE | Freq: Two times a day (BID) | ORAL | Status: DC
  Filled 2021-02-22 (×2): qty 1

## 2021-02-22 MED ORDER — HYDROCODONE-ACETAMINOPHEN 5-325 MG PO TABS: 1.0000 | ORAL_TABLET | Freq: Three times a day (TID) | ORAL | 0 refills | Status: AC | PRN

## 2021-02-22 MED ORDER — CHLORHEXIDINE GLUCONATE 0.12 % MT SOLN
OROMUCOSAL | Status: AC
  Filled 2021-02-22: qty 15

## 2021-02-22 MED ORDER — PROPOFOL 500 MG/50ML IV EMUL
INTRAVENOUS | Status: DC | PRN
  Administered 2021-02-22: 80 ug/kg/min via INTRAVENOUS

## 2021-02-22 MED ORDER — HYDROMORPHONE HCL 1 MG/ML IJ SOLN: 0.2500 mg | INTRAMUSCULAR | Status: DC | PRN

## 2021-02-22 MED ORDER — PHENYLEPHRINE HCL-NACL 10-0.9 MG/250ML-% IV SOLN
INTRAVENOUS | Status: DC | PRN
  Administered 2021-02-22: 15 ug/min via INTRAVENOUS

## 2021-02-22 MED ORDER — ACETAMINOPHEN 500 MG PO TABS
500.0000 mg | ORAL_TABLET | Freq: Three times a day (TID) | ORAL | Status: DC
  Administered 2021-02-23: 500 mg via ORAL
  Filled 2021-02-22: qty 1

## 2021-02-22 MED ORDER — METHOCARBAMOL 1000 MG/10ML IJ SOLN
500.0000 mg | Freq: Four times a day (QID) | INTRAVENOUS | Status: DC | PRN
  Filled 2021-02-22: qty 5

## 2021-02-22 MED ORDER — METOCLOPRAMIDE HCL 5 MG PO TABS: 5.0000 mg | ORAL_TABLET | Freq: Three times a day (TID) | ORAL | Status: DC | PRN

## 2021-02-22 MED ORDER — OXYCODONE HCL 5 MG PO TABS: 5.0000 mg | ORAL_TABLET | Freq: Once | ORAL | Status: DC | PRN

## 2021-02-22 MED ORDER — LACTATED RINGERS IV SOLN: INTRAVENOUS | Status: DC

## 2021-02-22 MED ORDER — BUPIVACAINE-EPINEPHRINE (PF) 0.5% -1:200000 IJ SOLN
INTRAMUSCULAR | Status: DC | PRN
  Administered 2021-02-22: 25 mL via PERINEURAL

## 2021-02-22 MED ORDER — FENTANYL CITRATE (PF) 250 MCG/5ML IJ SOLN
INTRAMUSCULAR | Status: AC
  Filled 2021-02-22: qty 5

## 2021-02-22 MED ORDER — LIDOCAINE 2% (20 MG/ML) 5 ML SYRINGE
INTRAMUSCULAR | Status: DC | PRN
  Administered 2021-02-22: 40 mg via INTRAVENOUS

## 2021-02-22 MED ORDER — CHLORHEXIDINE GLUCONATE 0.12 % MT SOLN
15.0000 mL | Freq: Once | OROMUCOSAL | Status: AC
  Administered 2021-02-22: 15 mL via OROMUCOSAL

## 2021-02-22 MED ORDER — OXYCODONE HCL 5 MG/5ML PO SOLN: 5.0000 mg | Freq: Once | ORAL | Status: DC | PRN

## 2021-02-22 MED ORDER — SODIUM CHLORIDE 0.9 % IR SOLN
Status: DC | PRN
  Administered 2021-02-22: 1000 mL

## 2021-02-22 MED ORDER — MIDAZOLAM HCL 2 MG/2ML IJ SOLN
INTRAMUSCULAR | Status: DC | PRN
  Administered 2021-02-22 (×2): 1 mg via INTRAVENOUS

## 2021-02-22 MED ORDER — PROMETHAZINE HCL 25 MG/ML IJ SOLN: 6.2500 mg | INTRAMUSCULAR | Status: DC | PRN

## 2021-02-22 MED ORDER — CEFAZOLIN SODIUM-DEXTROSE 2-3 GM-%(50ML) IV SOLR
INTRAVENOUS | Status: DC | PRN
  Administered 2021-02-22: 2 g via INTRAVENOUS

## 2021-02-22 MED ORDER — MORPHINE SULFATE (PF) 2 MG/ML IV SOLN
1.0000 mg | Freq: Once | INTRAVENOUS | Status: AC
  Administered 2021-02-22: 1 mg via INTRAVENOUS
  Filled 2021-02-22: qty 1

## 2021-02-22 MED ORDER — METHOCARBAMOL 500 MG PO TABS: 500.0000 mg | ORAL_TABLET | Freq: Four times a day (QID) | ORAL | 0 refills | Status: AC | PRN

## 2021-02-22 MED ORDER — METOCLOPRAMIDE HCL 5 MG/ML IJ SOLN: 5.0000 mg | Freq: Three times a day (TID) | INTRAMUSCULAR | Status: DC | PRN

## 2021-02-22 MED ORDER — 0.9 % SODIUM CHLORIDE (POUR BTL) OPTIME
TOPICAL | Status: DC | PRN
  Administered 2021-02-22: 1000 mL

## 2021-02-22 MED ORDER — ACETAMINOPHEN 500 MG PO TABS
ORAL_TABLET | ORAL | Status: AC
  Administered 2021-02-22: 1000 mg via ORAL
  Filled 2021-02-22: qty 2

## 2021-02-22 MED ORDER — POTASSIUM CHLORIDE IN NACL 20-0.9 MEQ/L-% IV SOLN: INTRAVENOUS | Status: DC

## 2021-02-22 MED ORDER — CEFAZOLIN SODIUM-DEXTROSE 2-4 GM/100ML-% IV SOLN
2.0000 g | Freq: Three times a day (TID) | INTRAVENOUS | Status: DC
  Administered 2021-02-23: 2 g via INTRAVENOUS
  Filled 2021-02-22 (×3): qty 100

## 2021-02-22 MED ORDER — SODIUM CHLORIDE 0.9 % IR SOLN
Status: DC | PRN
  Administered 2021-02-22: 3000 mL

## 2021-02-22 MED ORDER — SODIUM CHLORIDE 0.9 % IV BOLUS
1000.0000 mL | Freq: Once | INTRAVENOUS | Status: AC
  Administered 2021-02-22: 1000 mL via INTRAVENOUS

## 2021-02-22 MED ORDER — TETANUS-DIPHTH-ACELL PERTUSSIS 5-2.5-18.5 LF-MCG/0.5 IM SUSY
0.5000 mL | PREFILLED_SYRINGE | Freq: Once | INTRAMUSCULAR | Status: AC
  Administered 2021-02-22: 0.5 mL via INTRAMUSCULAR
  Filled 2021-02-22: qty 0.5

## 2021-02-22 MED ORDER — DEXAMETHASONE SODIUM PHOSPHATE 10 MG/ML IJ SOLN
INTRAMUSCULAR | Status: DC | PRN
  Administered 2021-02-22: 5 mg via INTRAVENOUS

## 2021-02-22 SURGICAL SUPPLY — 87 items
BIT DRILL EVOS SHORT 2.0 (BIT) ×2
BIT DRILL EVOS SHRT OVRDRV 2.0 (DRILL) IMPLANT
BIT DRILL QC 2.5MM SHRT EVO SM (DRILL) IMPLANT
BIT DRILL QC EVOS OVER 2.4 (DRILL) IMPLANT
BIT DRILL SRG SHRT 2XAO CNCT (BIT) IMPLANT
BIT DRILL SURG QC A/O 1.5 EVOS (BIT) IMPLANT
BIT DRILL SURG QC EVOS 2.0 (DRILL) IMPLANT
BIT DRL SRG SHRT 2XAO QCK CNCT (BIT) ×1
BLADE CLIPPER SURG (BLADE) ×1 IMPLANT
BLADE SURG 15 STRL LF DISP TIS (BLADE) IMPLANT
BLADE SURG 15 STRL SS (BLADE) ×2
BNDG CMPR 9X4 STRL LF SNTH (GAUZE/BANDAGES/DRESSINGS) ×1
BNDG ELASTIC 4X5.8 VLCR STR LF (GAUZE/BANDAGES/DRESSINGS) ×2 IMPLANT
BNDG ESMARK 4X9 LF (GAUZE/BANDAGES/DRESSINGS) ×2 IMPLANT
BRUSH SCRUB EZ PLAIN DRY (MISCELLANEOUS) ×4 IMPLANT
COVER SURGICAL LIGHT HANDLE (MISCELLANEOUS) ×4 IMPLANT
COVER WAND RF STERILE (DRAPES) ×1 IMPLANT
CUFF TOURN SGL QUICK 18X4 (TOURNIQUET CUFF) ×1 IMPLANT
DECANTER SPIKE VIAL GLASS SM (MISCELLANEOUS) IMPLANT
DRAPE C-ARM 42X72 X-RAY (DRAPES) ×1 IMPLANT
DRAPE C-ARMOR (DRAPES) ×1 IMPLANT
DRAPE OEC MINIVIEW 54X84 (DRAPES) ×1 IMPLANT
DRILL EVOS SHORT OVERDRIVE 2.0 (DRILL) ×2
DRILL QC 2.5MM SHORT EVOS SM (DRILL) ×2
DRILL QC EVOS OVER 2.4 (DRILL)
DRILL SURG QC A/O 1.5MM EVOS (BIT) ×2
DRILL SURG QC EVOS 2.0 (DRILL) ×2
DRSG EMULSION OIL 3X3 NADH (GAUZE/BANDAGES/DRESSINGS) ×1 IMPLANT
DRSG MEPITEL 4X7.2 (GAUZE/BANDAGES/DRESSINGS) ×1 IMPLANT
ELECT REM PT RETURN 9FT ADLT (ELECTROSURGICAL) ×2
ELECTRODE REM PT RTRN 9FT ADLT (ELECTROSURGICAL) ×1 IMPLANT
GAUZE SPONGE 4X4 12PLY STRL (GAUZE/BANDAGES/DRESSINGS) ×2 IMPLANT
GAUZE SPONGE 4X4 12PLY STRL LF (GAUZE/BANDAGES/DRESSINGS) ×1 IMPLANT
GLOVE BIO SURGEON STRL SZ7.5 (GLOVE) ×1 IMPLANT
GLOVE BIO SURGEON STRL SZ8 (GLOVE) ×4 IMPLANT
GLOVE BIOGEL PI IND STRL 7.5 (GLOVE) ×1 IMPLANT
GLOVE BIOGEL PI INDICATOR 7.5 (GLOVE)
GLOVE SRG 8 PF TXTR STRL LF DI (GLOVE) ×1 IMPLANT
GLOVE SURG UNDER POLY LF SZ8 (GLOVE) ×2
GOWN STRL REUS W/ TWL LRG LVL3 (GOWN DISPOSABLE) ×2 IMPLANT
GOWN STRL REUS W/ TWL XL LVL3 (GOWN DISPOSABLE) ×1 IMPLANT
GOWN STRL REUS W/TWL LRG LVL3 (GOWN DISPOSABLE) ×4
GOWN STRL REUS W/TWL XL LVL3 (GOWN DISPOSABLE) ×2
HANDPIECE INTERPULSE COAX TIP (DISPOSABLE) ×4
KIT BASIN OR (CUSTOM PROCEDURE TRAY) ×2 IMPLANT
KIT TURNOVER KIT B (KITS) ×2 IMPLANT
MANIFOLD NEPTUNE II (INSTRUMENTS) ×2 IMPLANT
NDL HYPO 25GX1X1/2 BEV (NEEDLE) IMPLANT
NEEDLE HYPO 25GX1X1/2 BEV (NEEDLE) IMPLANT
NS IRRIG 1000ML POUR BTL (IV SOLUTION) ×2 IMPLANT
PACK ORTHO EXTREMITY (CUSTOM PROCEDURE TRAY) ×2 IMPLANT
PAD ARMBOARD 7.5X6 YLW CONV (MISCELLANEOUS) ×4 IMPLANT
PAD CAST 3X4 CTTN HI CHSV (CAST SUPPLIES) ×1 IMPLANT
PADDING CAST COTTON 3X4 STRL (CAST SUPPLIES) ×2
PLATE RAD SHAFT EVOS 98 8H (Plate) ×1 IMPLANT
PLATE VA STREN EVOS 2.7X140 (Plate) ×1 IMPLANT
SCREW BN 10X2XSTRL CORT SS (Screw) IMPLANT
SCREW CORT 2.7X10 STAR T8 EVOS (Screw) ×1 IMPLANT
SCREW CORT 3.5X13MM (Screw) ×4 IMPLANT
SCREW CORT EVOS ST 2.7X13 (Screw) ×1 IMPLANT
SCREW CORT EVOS ST 3.5X12 (Screw) ×3 IMPLANT
SCREW CORT ST EVOS 2X10 (Screw) ×2 IMPLANT
SCREW CORT ST EVOS 2X12 (Screw) ×1 IMPLANT
SCREW CORT ST EVOS 2X14 (Screw) ×1 IMPLANT
SCREW CORTEX 2.7X12 (Screw) ×4 IMPLANT
SCREW EVOS 2.7X11 LOCK T8 (Screw) ×1 IMPLANT
SCREW LOCK 2.7X13 ST EVOS (Screw) ×1 IMPLANT
SET HNDPC FAN SPRY TIP SCT (DISPOSABLE) IMPLANT
SLING ARM IMMOBILIZER MED (SOFTGOODS) ×1 IMPLANT
SOAP 2 % CHG 4 OZ (WOUND CARE) ×1 IMPLANT
SPONGE LAP 18X18 X RAY DECT (DISPOSABLE) ×2 IMPLANT
STAPLER VISISTAT 35W (STAPLE) ×2 IMPLANT
SUCTION FRAZIER HANDLE 10FR (MISCELLANEOUS) ×2
SUCTION TUBE FRAZIER 10FR DISP (MISCELLANEOUS) ×1 IMPLANT
SUT BONE WAX W31G (SUTURE) ×1 IMPLANT
SUT ETHILON 3 0 PS 1 (SUTURE) ×2 IMPLANT
SUT PDS AB 2-0 CT1 27 (SUTURE) ×2 IMPLANT
SUT PROLENE 0 CT (SUTURE) IMPLANT
SUT VIC AB 2-0 CT1 27 (SUTURE) ×2
SUT VIC AB 2-0 CT1 TAPERPNT 27 (SUTURE) ×1 IMPLANT
SUT VIC AB 2-0 CT3 27 (SUTURE) IMPLANT
SYR CONTROL 10ML LL (SYRINGE) IMPLANT
TOWEL GREEN STERILE (TOWEL DISPOSABLE) ×4 IMPLANT
TOWEL GREEN STERILE FF (TOWEL DISPOSABLE) ×2 IMPLANT
TUBE CONNECTING 12X1/4 (SUCTIONS) ×2 IMPLANT
UNDERPAD 30X36 HEAVY ABSORB (UNDERPADS AND DIAPERS) ×2 IMPLANT
WATER STERILE IRR 1000ML POUR (IV SOLUTION) ×1 IMPLANT

## 2021-02-22 NOTE — ED Provider Notes (Signed)
[facility] EMERGENCY DEPARTMENT Provider Note   CSN: [id number] Arrival date & time: 02/22/21  1045     History No chief complaint on file.   [name] is a 55 y.o. female.  HPI Patient presents with left forearm fracture after fall.  Was running and tripping, assisted by her dog.  Landed onto her left hand.  Pain and deformity left forearm.  Exposed ulna.  Had been given fentanyl and Ancef by EMS.  Otherwise pretty healthy.  Not on anticoagulation.  Did not hit head.  Is diaphoretic upon arrival and reportedly it was that way there since she had run about 40 minutes already.    Past Medical History:  Diagnosis Date   Asthma    Bleeding nose    Chills    Contact lens/glasses fitting    Dizziness    Eczema    Hemorrhoids    Sinus problem    Sore throat    Weakness     Patient Active Problem List   Diagnosis Date Noted   Open left forearm fracture 02/22/2021   Other specified hypothyroidism 04/05/2019   Mild intermittent asthma without complication 04/05/2019    Past Surgical History:  Procedure Laterality Date   CESAREAN SECTION  2010   FOOT SURGERY     TONSILLECTOMY AND ADENOIDECTOMY     TOTAL THYROIDECTOMY       OB History   No obstetric history on file.     Family History  Problem Relation Age of Onset   Diabetes Mother    Heart disease Mother    Kidney failure Mother    Diabetes Brother     Social History   Tobacco Use   Smoking status: Never   Smokeless tobacco: Never  Substance Use Topics   Alcohol use: No   Drug use: No    Home Medications Prior to Admission medications   Medication Sig Start Date End Date Taking? Authorizing Provider  levothyroxine (SYNTHROID) 137 MCG tablet Take 1 tablet (137 mcg total) by mouth daily before breakfast. 04/05/19  Yes Corum, Minerva Fester, MD  fluticasone-salmeterol (ADVAIR HFA) 115-21 MCG/ACT inhaler Inhale 2 puffs into the lungs 2 (two) times daily. 04/10/20   [provider]     Allergies    Patient has no known allergies.  Review of Systems   Review of Systems  Constitutional:  Positive for diaphoresis. Negative for appetite change.  HENT:  Negative for congestion.   Respiratory:  Negative for shortness of breath.   Gastrointestinal:  Negative for abdominal pain.  Genitourinary:  Negative for flank pain.  Musculoskeletal:        Left forearm injury.  Skin:  Positive for wound.  Neurological:  Negative for weakness.  Psychiatric/Behavioral:  Negative for confusion.    Physical Exam Updated Vital Signs BP 114/75 (BP Location: Right Arm)   Pulse (!) 52   Temp 97.7 F (36.5 C) (Oral)   Resp 16   Ht 5\' 6"  (1.676 m)   Wt 78.5 kg   LMP 05/29/2020 Comment: Preganacy Test Verified with Preop Nurse  SpO2 100%   BMI 27.92 kg/m   Physical Exam Constitutional:      Appearance: Normal appearance. She is diaphoretic.  HENT:     Head: Normocephalic.  Eyes:     Pupils: Pupils are equal, round, and reactive to light.  Cardiovascular:     Rate and Rhythm: Regular rhythm.  Pulmonary:     Breath sounds: No wheezing or rhonchi.  Abdominal:     Tenderness: There is no abdominal tenderness.  Musculoskeletal:        General: Deformity present.     Cervical back: Neck supple.     Comments: Deformity left mid forearm.  Particular and ulnar aspect.  There is about 1 cm exposed ulna out of the skin.  Neurovascular intact in left hand.  No tenderness over wrist or elbow.  No tenderness over hand.  Radial pulse intact.  No tenderness over shoulder.  Skin:    Capillary Refill: Capillary refill takes less than 2 seconds.  Neurological:     General: No focal deficit present.     Mental Status: She is alert.  Psychiatric:        Mood and Affect: Mood normal.    ED Results / Procedures / Treatments   Labs (all labs ordered are listed, but only abnormal results are displayed) Labs Reviewed  BASIC METABOLIC PANEL - Abnormal; Notable for the following components:       Result Value   Glucose, Bld 136 (*)    All other components within normal limits  RESP PANEL BY RT-PCR (FLU A&B, COVID) ARPGX2  CBC  VITAMIN D 25 HYDROXY (VIT D DEFICIENCY, FRACTURES)  CBC  POCT PREGNANCY, URINE    EKG None  Radiology DG Forearm Left  Result Date: 02/22/2021 CLINICAL DATA:  Operative fixation of radius and ulna fractures. EXAM: LEFT FOREARM - 2 VIEW COMPARISON:  C-arm radiographs obtained earlier today. Left forearm radiographs obtained earlier today. FINDINGS: Screw plate fixation of the previously described distal radius and ulna fractures. Essentially anatomic position and alignment on these views. There is no true lateral view available. IMPRESSION: Limited examination demonstrating hardware fixation of the previously described distal radius and ulnar fractures with essentially anatomic position and alignment on the supplied images. Electronically Signed   By: Beckie Salts M.D.   On: 02/22/2021 18:32   DG Forearm Left  Result Date: 02/22/2021 CLINICAL DATA:  ORIF left forearm. FLUOROSCOPY TIME:  51 seconds. Images: 3 EXAM: LEFT FOREARM - 2 VIEW COMPARISON:  None. FINDINGS: Plates have been affixed both the distal radius and ulna with multiple screws resulting in repair of the ulnar and radial fractures. Hardware is in good position. Alignment is now near anatomic. IMPRESSION: Radius and ulnar fracture repairs as above. Electronically Signed   By: Gerome Sam III M.D   On: 02/22/2021 18:15   DG Forearm Left  Result Date: 02/22/2021 CLINICAL DATA:  Tripped by dog while walking. EXAM: LEFT FOREARM - 2 VIEW COMPARISON:  None. FINDINGS: Exam demonstrates displaced transverse fractures of the distal diaphysis of the radius and ulna. 1.5 shaft's with of lateral displacement of the distal ulnar fragment and 1 shaft's with of lateral displacement of the distal radial fragment. Mild medial angulation of the distal radial fragment and mild dorsal medial angulation of the  distal ulnar fragment. Subtle comminution of the ulnar fracture site. Remainder the exam is unremarkable. IMPRESSION: Displaced fractures of the distal radial and ulnar diaphyses. Electronically Signed   By: Elberta Fortis M.D.   On: 02/22/2021 11:48   DG C-Arm 1-60 Min  Result Date: 02/22/2021 CLINICAL DATA:  ORIF left forearm. FLUOROSCOPY TIME:  51 seconds. Images: 3 EXAM: LEFT FOREARM - 2 VIEW COMPARISON:  None. FINDINGS: Plates have been affixed both the distal radius and ulna with multiple screws resulting in repair of the ulnar and radial fractures. Hardware is in good position. Alignment is now near anatomic. IMPRESSION: Radius  and ulnar fracture repairs as above. Electronically Signed   By: Gerome Sam III M.D   On: 02/22/2021 18:15   DG MINI C-ARM IMAGE ONLY  Result Date: 02/22/2021 There is no interpretation for this exam.  This order is for images obtained during a surgical procedure.  Please See "Surgeries" Tab for more information regarding the procedure.    Procedures Procedures   Medications Ordered in ED Medications  ceFAZolin (ANCEF) 2-4 GM/100ML-% IVPB (has no administration in time range)  levothyroxine (SYNTHROID) tablet 137 mcg (has no administration in time range)  0.9 % NaCl with KCl 20 mEq/ L  infusion (has no administration in time range)  docusate sodium (COLACE) capsule 100 mg (has no administration in time range)  ondansetron (ZOFRAN) tablet 4 mg (has no administration in time range)    Or  ondansetron (ZOFRAN) injection 4 mg (has no administration in time range)  metoCLOPramide (REGLAN) tablet 5-10 mg (has no administration in time range)    Or  metoCLOPramide (REGLAN) injection 5-10 mg (has no administration in time range)  ceFAZolin (ANCEF) IVPB 2g/100 mL premix (has no administration in time range)  acetaminophen (TYLENOL) tablet 325-650 mg (has no administration in time range)  morphine 2 MG/ML injection 0.5-1 mg (has no administration in time range)   acetaminophen (TYLENOL) tablet 500 mg (has no administration in time range)  HYDROcodone-acetaminophen (NORCO/VICODIN) 5-325 MG per tablet 1-2 tablet (has no administration in time range)  methocarbamol (ROBAXIN) tablet 500 mg (has no administration in time range)    Or  methocarbamol (ROBAXIN) 500 mg in dextrose 5 % 50 mL IVPB (has no administration in time range)  sodium chloride 0.9 % bolus 1,000 mL (0 mLs Intravenous Stopped 02/22/21 1252)  Tdap (BOOSTRIX) injection 0.5 mL (0.5 mLs Intramuscular Given 02/22/21 1304)  acetaminophen (TYLENOL) tablet 1,000 mg (1,000 mg Oral Given 02/22/21 1347)  morphine 2 MG/ML injection 1 mg (1 mg Intravenous Given 02/22/21 1313)  chlorhexidine (PERIDEX) 0.12 % solution 15 mL (15 mLs Mouth/Throat Given 02/22/21 1357)    ED Course  I have reviewed the triage vital signs and the nursing notes.  Pertinent labs & imaging results that were available during my care of the patient were reviewed by me and considered in my medical decision making (see chart for details).    MDM Rules/Calculators/A&P                          Patient with mechanical fall.  Tripped while running.  Open forearm fracture with exposed ulna.  Also displaced fracture of the radius.  Ancef has been given.  Tetanus has been updated.  Discussed with Dr. Carola Frost, who will take to the OR.  No other injury seen. Final Clinical Impression(s) / ED Diagnoses Final diagnoses:  Type I or II open fracture of left forearm, initial encounter    Rx / DC Orders ED Discharge Orders     None        Benjiman Core, MD 02/22/21 2002

## 2021-02-22 NOTE — Anesthesia Preprocedure Evaluation (Addendum)
Anesthesia Evaluation  Patient identified by MRN, date of birth, ID band Patient awake    Reviewed: Allergy & Precautions, NPO status , Patient's Chart, lab work & pertinent test results  History of Anesthesia Complications Negative for: history of anesthetic complications  Airway Mallampati: I  TM Distance: >3 FB Neck ROM: Full    Dental  (+) Dental Advisory Given, Teeth Intact   Pulmonary asthma , COPD (rarely needs inhaler),  COPD inhaler,    breath sounds clear to auscultation       Cardiovascular negative cardio ROS   Rhythm:Regular Rate:Normal     Neuro/Psych    GI/Hepatic negative GI ROS, Neg liver ROS,   Endo/Other  Hypothyroidism   Renal/GU negative Renal ROS     Musculoskeletal   Abdominal   Peds  Hematology negative hematology ROS (+)   Anesthesia Other Findings   Reproductive/Obstetrics                            Anesthesia Physical Anesthesia Plan  ASA: 2  Anesthesia Plan: Regional and MAC   Post-op Pain Management:    Induction:   PONV Risk Score and Plan: 2 and Ondansetron and Dexamethasone  Airway Management Planned: Natural Airway and Simple Face Mask  Additional Equipment: None  Intra-op Plan:   Post-operative Plan:   Informed Consent: I have reviewed the patients History and Physical, chart, labs and discussed the procedure including the risks, benefits and alternatives for the proposed anesthesia with the patient or authorized representative who has indicated his/her understanding and acceptance.     Dental advisory given  Plan Discussed with: CRNA and Surgeon  Anesthesia Plan Comments:        Anesthesia Quick Evaluation

## 2021-02-22 NOTE — Discharge Instructions (Signed)
Orthopaedic Trauma Service Discharge Instructions   General Discharge Instructions  Orthopaedic Injuries:  Open Left forearm fracture treated with irrigation and debridement and open reduction and internal fixation with plates and screws  WEIGHT BEARING STATUS: no lifting with left arm, no pushing/pulling  RANGE OF MOTION/ACTIVITY:finger and elbow motion as tolerated. This will help with swelling as well.  No wrist motion as you are in a splint   Wound Care: do not remove splint, do not get splint wet    Diet: as you were eating previously.  Can use over the counter stool softeners and bowel preparations, such as Miralax, to help with bowel movements.  Narcotics can be constipating.  Be sure to drink plenty of fluids  PAIN MEDICATION USE AND EXPECTATIONS  You have likely been given narcotic medications to help control your pain.  After a traumatic event that results in an fracture (broken bone) with or without surgery, it is ok to use narcotic pain medications to help control one's pain.  We understand that everyone responds to pain differently and each individual patient will be evaluated on a regular basis for the continued need for narcotic medications. Ideally, narcotic medication use should last no more than 6-8 weeks (coinciding with fracture healing).   As a patient it is your responsibility as well to monitor narcotic medication use and report the amount and frequency you use these medications when you come to your office visit.   We would also advise that if you are using narcotic medications, you should take a dose prior to therapy to maximize you participation.  IF YOU ARE ON NARCOTIC MEDICATIONS IT IS NOT PERMISSIBLE TO OPERATE A MOTOR VEHICLE (MOTORCYCLE/CAR/TRUCK/MOPED) OR HEAVY MACHINERY DO NOT MIX NARCOTICS WITH OTHER CNS (CENTRAL NERVOUS SYSTEM) DEPRESSANTS SUCH AS ALCOHOL   POST-OPERATIVE OPIOID TAPER INSTRUCTIONS: It is important to wean off of your opioid medication  as soon as possible. If you do not need pain medication after your surgery it is ok to stop day one. Opioids include: Codeine, Hydrocodone(Norco, Vicodin), Oxycodone(Percocet, oxycontin) and hydromorphone amongst others.  Long term and even short term use of opiods can cause: Increased pain response Dependence Constipation Depression Respiratory depression And more.  Withdrawal symptoms can include Flu like symptoms Nausea, vomiting And more Techniques to manage these symptoms Hydrate well Eat regular healthy meals Stay active Use relaxation techniques(deep breathing, meditating, yoga) Do Not substitute Alcohol to help with tapering If you have been on opioids for less than two weeks and do not have pain than it is ok to stop all together.  Plan to wean off of opioids This plan should start within one week post op of your fracture surgery  Maintain the same interval or time between taking each dose and first decrease the dose.  Cut the total daily intake of opioids by one tablet each day Next start to increase the time between doses. The last dose that should be eliminated is the evening dose.    STOP SMOKING OR USING NICOTINE PRODUCTS!!!!  As discussed nicotine severely impairs your body's ability to heal surgical and traumatic wounds but also impairs bone healing.  Wounds and bone heal by forming microscopic blood vessels (angiogenesis) and nicotine is a vasoconstrictor (essentially, shrinks blood vessels).  Therefore, if vasoconstriction occurs to these microscopic blood vessels they essentially disappear and are unable to deliver necessary nutrients to the healing tissue.  This is one modifiable factor that you can do to dramatically increase your chances of healing your  injury.    (This means no smoking, no nicotine gum, patches, etc)  DO NOT USE NONSTEROIDAL ANTI-INFLAMMATORY DRUGS (NSAID'S)  Using products such as Advil (ibuprofen), Aleve (naproxen), Motrin (ibuprofen) for  additional pain control during fracture healing can delay and/or prevent the healing response.  If you would like to take over the counter (OTC) medication, Tylenol (acetaminophen) is ok.  However, some narcotic medications that are given for pain control contain acetaminophen as well. Therefore, you should not exceed more than 4000 mg of tylenol in a day if you do not have liver disease.  Also note that there are may OTC medicines, such as cold medicines and allergy medicines that my contain tylenol as well.  If you have any questions about medications and/or interactions please ask your doctor/PA or your pharmacist.      ICE AND ELEVATE INJURED/OPERATIVE EXTREMITY  Using ice and elevating the injured extremity above your heart can help with swelling and pain control.  Icing in a pulsatile fashion, such as 20 minutes on and 20 minutes off, can be followed.    Do not place ice directly on skin. Make sure there is a barrier between to skin and the ice pack.    Using frozen items such as frozen peas works well as the conform nicely to the are that needs to be iced.  USE AN ACE WRAP OR TED HOSE FOR SWELLING CONTROL  In addition to icing and elevation, Ace wraps or TED hose are used to help limit and resolve swelling.  It is recommended to use Ace wraps or TED hose until you are informed to stop.    When using Ace Wraps start the wrapping distally (farthest away from the body) and wrap proximally (closer to the body)   Example: If you had surgery on your leg or thing and you do not have a splint on, start the ace wrap at the toes and work your way up to the thigh        If you had surgery on your upper extremity and do not have a splint on, start the ace wrap at your fingers and work your way up to the upper arm  IF YOU ARE IN A SPLINT OR CAST DO NOT REMOVE IT FOR ANY REASON   If your splint gets wet for any reason please contact the office immediately. You may shower in your splint or cast as long as you  keep it dry.  This can be done by wrapping in a cast cover or garbage back (or similar)  Do Not stick any thing down your splint or cast such as pencils, money, or hangers to try and scratch yourself with.  If you feel itchy take benadryl as prescribed on the bottle for itching  IF YOU ARE IN A CAM BOOT (BLACK BOOT)  You may remove boot periodically. Perform daily dressing changes as noted below.  Wash the liner of the boot regularly and wear a sock when wearing the boot. It is recommended that you sleep in the boot until told otherwise    Call office for the following: Temperature greater than 101F Persistent nausea and vomiting Severe uncontrolled pain Redness, tenderness, or signs of infection (pain, swelling, redness, odor or green/yellow discharge around the site) Difficulty breathing, headache or visual disturbances Hives Persistent dizziness or light-headedness Extreme fatigue Any other questions or concerns you may have after discharge  In an emergency, call 911 or go to an Emergency Department at a nearby hospital  HELPFUL INFORMATION  If you had a block, it will wear off between 8-24 hrs postop typically.  This is period when your pain may go from nearly zero to the pain you would have had postop without the block.  This is an abrupt transition but nothing dangerous is happening.  You may take an extra dose of narcotic when this happens.  You should wean off your narcotic medicines as soon as you are able.  Most patients will be off or using minimal narcotics before their first postop appointment.   We suggest you use the pain medication the first night prior to going to bed, in order to ease any pain when the anesthesia wears off. You should avoid taking pain medications on an empty stomach as it will make you nauseous.  Do not drink alcoholic beverages or take illicit drugs when taking pain medications.  In most states it is against the law to drive while you are in a  splint or sling.  And certainly against the law to drive while taking narcotics.  You may return to work/school in the next couple of days when you feel up to it.   Pain medication may make you constipated.  Below are a few solutions to try in this order: Decrease the amount of pain medication if you aren't having pain. Drink lots of decaffeinated fluids. Drink prune juice and/or each dried prunes  If the first 3 don't work start with additional solutions Take Colace - an over-the-counter stool softener Take Senokot - an over-the-counter laxative Take Miralax - a stronger over-the-counter laxative     CALL THE OFFICE WITH ANY QUESTIONS OR CONCERNS: [phone]   VISIT OUR WEBSITE FOR ADDITIONAL INFORMATION: [email or web address]

## 2021-02-22 NOTE — Op Note (Signed)
[name], [name] MEDICAL RECORD NO: [id number] ACCOUNT NO: [id number] DATE OF BIRTH: May 11, 1966 FACILITY: [facility] LOCATION: [facility] PHYSICIAN: Doralee Albino. Carola Frost, MD  Operative Report   DATE OF PROCEDURE: 02/22/2021  PREOPERATIVE DIAGNOSES: 1.  Type 2 open left ulna fracture. 2.  Radial shaft fracture with distal radial ulnar joint dislocation (Galeazzi fracture dislocation).  POSTOPERATIVE DIAGNOSES: 1.  Type 2 open left ulna fracture. 2.  Radial shaft fracture with distal radial ulnar joint dislocation (Galeazzi fracture dislocation).  PROCEDURES:   1.  ORIF of left radius with closed reduction of the distal radioulnar joint. 2.  ORIF of left ulna fracture 3.  Irrigation and debridement of type 2 open left ulna including bone.  SURGEON:  Myrene Galas, MD  ASSISTANT: 1.  Montez Morita, PA-C. 2.  PA student.  ANESTHESIA:  Regional.  TOURNIQUET:  None.  COMPLICATIONS:  None.  SPECIMENS:  None.  DISPOSITION:  To PACU.  CONDITION:  Stable.  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  Subsequent  evaluation in the emergency department confirmed open both bone forearm fracture with comminution of the ulna and a dislocated DRUJ.  I did discuss with the patient preoperatively the need for emergent debridement with plans for immediate ORIF as well.  Risks included  infection, nerve injury, vessel injury, loss of motion, DVT, PE, the possibility of needing to open reduce the distal radioulnar joint or to pin it in the event it was unstable following repair of the forearm and that this could precipitate heterotopic  ossification with placement of a pin.  She acknowledged these risks and others and strongly wished to proceed and provided consent to do so.  BRIEF SUMMARY OF THE PROCEDURE:  The  patient received regional block preoperatively and was given additional 2 grams of Ancef preoperatively, as her first dose had been in EMS transport.  She was sedated and then the left upper extremity cleaned with a chlorhexidine sponge and wash, this is followed  by Betadine scrub and paint and then a standard draping.  Timeout was held.  The ulnar-sided wound was extended proximally and distally to expose the bone ends.  The more proximal aspect of the ulna had been the one to penetrate the skin.  It was  stripped several centimeters.  There was a small free cortical piece of bone, which was excised, and a large butterfly with periosteal attachment that was carefully preserved.  I also excised contaminated devitalized subcutaneous tissue and muscle fascia as well as a small skin edge.  Following this excision and then chlorhexidine  supplemented wash irrigation was performed with 3000 mL of saline on that side.  I then delivered the opposite bone end which really was not involved, but did perform a curettage and irrigation with 1000 mL there. It was difficult to restore length.  I was unable to get the immediate read and reduction there and hold it in a way that would not jeopardize comminution of the fracture site or stripping of that butterfly fragment and consequently I left that side and  turned my attention to the radius.  Montez Morita, PA-C was assisting throughout with retraction and delivery of the bone ends with the debridement.  A standard volar Sherilyn Cooter approach was made through an 8 cm incision centered over the fracture site.  Hemostasis was obtained with  electrocautery going to the subcutaneous tissues and then the interval identified between the mobile wad and flexor carpi  radialis.  The vessel was identified on the radial side and small branches were controlled with bipolar cautery.  The median nerve was also transiently visible and was intact in excellent condition and not disturbed throughout  the case.  The bone was  exposed and was quite short.  I used a lobster claw and elevator to control and then reveal the fracture site.  I was very careful to leave all the periosteum on it even as I did mobilize the muscle off of the periosteum.  Here, the bone ends were  irrigated thoroughly, cleaned with a curette of the hematoma and it was sufficiently comminuted, it was very difficult to gain a reduction with the clamps and maintain it.  I did place a 2 mm lag screw from volar to dorsal and this helped enable me to  remove the clamps for placement of a 8-hole plate from the [facility] system using their anatomic radial plate.  I secured fixation proximally and distally, which appeared to be within the middle of the bone and then additional screw to hold  rotation.  Additional screw was placed because of the comminution, my concern about holding the reduction.  Then, I turned attention back to the ulna.  Here, the plate was cut to the appropriate length and then a reduction teased into place with a  pointed tenaculum.  This was secured with another 2.0 mm screw using lag technique with what appeared to be anatomic reduction.  The small plate was overlaid with this securing 4 bicortical screws on either side of the fracture.  I did have to translate  the plate after initially taking an x-ray a 1 hole proximally and appeared to be in excellent position.  I then returned to the radius placed an additional two bicortical screws, one on each side of the fracture and reduction appeared to be anatomic with  acceptable position of the hardware.  The lag screw did end up being a 2 mm long, but I did not wish to expose it on the opposite side because of the risk of stripping the blood supply and it was acceptable.  It should not produce any concerns.  The screw head was below the plate.  The DRUJ was carefully evaluated at the time of reduction of the ulna and just after  provisional plating and my  manipulations of the bones did produce a closed reduction of the joint. The DRUJ was subsequently stable to examination following all instrumentation.  Consequently, no open reduction or pinning or other control of the wrist position postoperatively  was necessary.  Wounds were irrigated thoroughly and then closed in standard layered fashion using a 2-0 PDS and 2-0 nylon.  Montez Morita, PA-C was present and assisted throughout.  The patient was placed in a soft gently compressive dressing with a volar splint.  She was taken to PACU in stable condition.  PROGNOSIS:  The patient will continue to receive antibiotics over the next 24 hours to be discharged with plans for followup and removal of her sutures in 10 to 14 days and transition to a removable splint.  She is at elevated risk for nonunion and  infection given the open nature of her fracture.  We are hopeful that was medicated by the thorough debridement and excellent alignment and apposition she appears to have at this time.   PUS D: 02/22/2021 5:16:49  pm T: 02/22/2021 8:58:00 pm  JOB: [id number]/ [id number]

## 2021-02-22 NOTE — Transfer of Care (Signed)
Immediate Anesthesia Transfer of Care Note  Patient: [name]  Procedure(s) Performed: OPEN REDUCTION INTERNAL FIXATION (ORIF) DISTAL RADIAL FRACTURE (Left: Arm Lower)  Patient Location: PACU  Anesthesia Type:MAC and Regional  Level of Consciousness: awake, alert  and oriented  Airway & Oxygen Therapy: Patient Spontanous Breathing  Post-op Assessment: Report given to RN and Post -op Vital signs reviewed and stable  Post vital signs: Reviewed and stable  Last Vitals:  Vitals Value Taken Time  BP 133/71 02/22/21 1741  Temp 36.5 C 02/22/21 1741  Pulse 65 02/22/21 1742  Resp 10 02/22/21 1742  SpO2 100 % 02/22/21 1742  Vitals shown include unvalidated device data.  Last Pain:  Vitals:   02/22/21 1352  PainSc: 0-No pain         Complications: No notable events documented.

## 2021-02-22 NOTE — Progress Notes (Signed)
Patient received from PACU. Vitals taken, WNL. Dinner order placed. Patient resting comfortably in bed. Still drowsy from anesthesia.  Will endorse to night nurse.

## 2021-02-22 NOTE — Anesthesia Procedure Notes (Signed)
Anesthesia Regional Block: Supraclavicular block   Pre-Anesthetic Checklist: , timeout performed,  Correct Patient, Correct Site, Correct Laterality,  Correct Procedure, Correct Position, site marked,  Risks and benefits discussed,  Surgical consent,  Pre-op evaluation,  At surgeon's request  Laterality: Left and Upper  Prep: chloraprep       Needles:  Injection technique: Single-shot  Needle Type: Echogenic Needle     Needle Length: 9cm  Needle Gauge: 21     Additional Needles:   Procedures:,,,, ultrasound used (permanent image in chart),,    Narrative:  Start time: 02/22/2021 1:55 PM End time: 02/22/2021 2:01 PM Injection made incrementally with aspirations every 5 mL.  Performed by: Personally  Anesthesiologist: Jairo Ben, MD  Additional Notes: Pt identified in Holding room.  Monitors applied. Working IV access confirmed. Sterile prep L clavicle and neck.  #21ga ECHOgenic arrow block needle to supraclav brachial plexus with US guidance.  25cc 0.5% Bupivacaine with 1:200k epi injected incrementally after negative test dose.  Patient asymptomatic, VSS, no heme aspirated, tolerated well.   Sandford Craze, MD

## 2021-02-22 NOTE — Progress Notes (Signed)
POCT urine pregnacy test is negative.  Result not crossing over to epic.  CRNA aware of negative result.

## 2021-02-22 NOTE — Anesthesia Postprocedure Evaluation (Signed)
Anesthesia Post Note  Patient: [name]  Procedure(s) Performed: OPEN REDUCTION INTERNAL FIXATION (ORIF) DISTAL RADIAL FRACTURE (Left: Arm Lower)     Patient location during evaluation: PACU Anesthesia Type: Regional and MAC Level of consciousness: awake and alert, patient cooperative and oriented Pain management: pain level controlled Vital Signs Assessment: post-procedure vital signs reviewed and stable Respiratory status: spontaneous breathing, nonlabored ventilation, respiratory function stable and respiratory function unstable Cardiovascular status: blood pressure returned to baseline and stable Postop Assessment: no apparent nausea or vomiting Anesthetic complications: no   No notable events documented.  Last Vitals:  Vitals:   02/22/21 1200 02/22/21 1741  BP: 104/73 133/71  Pulse: (!) 50 67  Resp: 14 10  Temp:  36.5 C  SpO2: 100% 100%    Last Pain:  Vitals:   02/22/21 1741  PainSc: 0-No pain                 Hye Trawick,E. Genisis Sonnier

## 2021-02-22 NOTE — Consult Note (Signed)
Orthopaedic Trauma Service (OTS) Consult   Patient ID: [name] MRN: [id number] DOB/AGE: December 04, 1965 55 y.o.   Reason for Consult: Open left forearm fracture Referring Physician: Benjiman Core, MD (EDP)   HPI: [name] is an 55 y.o. right-hand-dominant female who sustained a fall while running with her dog today.  Patient lost her footing fell on an outstretched left hand.  Patient had immediate deformity and pain in her left wrist and forearm.  She was brought to [facility] where she was found to have an isolated open left distal forearm fracture.  Orthopedics consulted for management.  Patient seen and evaluated in the emergency department.  Complains only of left forearm pain.  Denies any additional injuries elsewhere.  Did not hit her head.  Did not lose consciousness.  Pain is isolated to her wrist and forearm.  Pain is relieved with pain medication and rest.  Exacerbated with movement.  Denies any numbness or tingling in her hand  Patient works as a Airline pilot for [employer].  Previously worked at [employer] back in [place] for the summer  Patient does not smoke no other drugs.  Hypothyroid  Last full meal was last night around 8 PM.  She did have some coffee this morning at 7:15 AM  2 g ancef given in ambulance  Boostrix ordered does not appear to have been given yet   Past Medical History:  Diagnosis Date   Asthma    Bleeding nose    Chills    Contact lens/glasses fitting    Dizziness    Eczema    Hemorrhoids    Sinus problem    Sore throat    Weakness     Past Surgical History:  Procedure Laterality Date   CESAREAN SECTION  2010   FOOT SURGERY     TONSILLECTOMY AND ADENOIDECTOMY     TOTAL THYROIDECTOMY      Family History  Problem Relation Age of Onset   Diabetes Mother    Heart disease Mother    Kidney failure Mother    Diabetes Brother     Social History:  reports that she does not drink alcohol and does not use drugs.  No history on file for tobacco use.  Allergies: No Known Allergies  Medications: I have reviewed the patient's current medications. Current Meds  Medication Sig   fluticasone-salmeterol (ADVAIR HFA) 115-21 MCG/ACT inhaler Inhale 2 puffs into the lungs 2 (two) times daily.   levothyroxine (SYNTHROID) 137 MCG tablet Take 1 tablet (137 mcg total) by mouth daily before breakfast.     Results for orders placed or performed during the hospital encounter of 02/22/21 (from the past 48 hour(s))  Basic metabolic panel     Status: Abnormal   Collection Time: 02/22/21 10:58 AM  Result Value Ref Range   Sodium 139 135 - 145 mmol/L   Potassium 3.9 3.5 - 5.1 mmol/L   Chloride 109 98 - 111 mmol/L   CO2 25 22 - 32 mmol/L   Glucose, Bld 136 (H) 70 - 99 mg/dL    Comment: Glucose reference range applies only to samples taken after fasting for at least 8 hours.   BUN 11 6 - 20 mg/dL   Creatinine, Ser 2.11 0.44 - 1.00 mg/dL   Calcium 9.1 8.9 - 94.1 mg/dL   GFR, Estimated >74 >08 mL/min    Comment: (NOTE) Calculated using the CKD-EPI Creatinine Equation (2021)    Anion gap 5  5 - 15    Comment: Performed at [facility], [address], [place], [place] [address]  CBC     Status: None   Collection Time: 02/22/21 10:58 AM  Result Value Ref Range   WBC 4.9 4.0 - 10.5 K/uL   RBC 4.38 3.87 - 5.11 MIL/uL   Hemoglobin 12.8 12.0 - 15.0 g/dL   HCT 47.4 25.9 - 56.3 %   MCV 88.4 80.0 - 100.0 fL   MCH 29.2 26.0 - 34.0 pg   MCHC 33.1 30.0 - 36.0 g/dL   RDW 87.5 64.3 - 32.9 %   Platelets 200 150 - 400 K/uL   nRBC 0.0 0.0 - 0.2 %    Comment: Performed at [facility], [address], [place], [place] [address]    DG Forearm Left  Result Date: 02/22/2021 CLINICAL DATA:  Tripped by dog while walking. EXAM: LEFT FOREARM - 2 VIEW COMPARISON:  None. FINDINGS: Exam demonstrates displaced transverse fractures of the distal diaphysis of the radius and ulna. 1.5 shaft's with of lateral displacement of  the distal ulnar fragment and 1 shaft's with of lateral displacement of the distal radial fragment. Mild medial angulation of the distal radial fragment and mild dorsal medial angulation of the distal ulnar fragment. Subtle comminution of the ulnar fracture site. Remainder the exam is unremarkable. IMPRESSION: Displaced fractures of the distal radial and ulnar diaphyses. Electronically Signed   By: Elberta Fortis M.D.   On: 02/22/2021 11:48    Intake/Output    None      Review of Systems  Constitutional:  Negative for chills and fever.  Eyes:  Negative for blurred vision.  Respiratory:  Negative for shortness of breath and wheezing.   Cardiovascular:  Negative for chest pain and palpitations.  Gastrointestinal:  Negative for abdominal pain, nausea and vomiting.  Neurological:  Negative for tingling and sensory change.  Blood pressure 101/64, pulse 61, resp. rate 15, SpO2 100 %. Physical Exam Vitals and nursing note reviewed.  Constitutional:      General: She is awake. She is not in acute distress.    Appearance: Normal appearance. She is well-developed and well-groomed.     Comments: Very pleasant, no acute distress  HENT:     Head: Normocephalic and atraumatic.     Mouth/Throat:     Mouth: Mucous membranes are moist.     Pharynx: Oropharynx is clear.  Cardiovascular:     Rate and Rhythm: Normal rate and regular rhythm.     Heart sounds: S1 normal and S2 normal.  Pulmonary:     Effort: Pulmonary effort is normal.     Breath sounds: Normal breath sounds.     Comments: Clear to auscultation bilaterally Musculoskeletal:     Cervical back: Full passive range of motion without pain and normal range of motion.     Comments: Left upper extremity Left wrist is supported in blue foam EMS splint Gauze applied to open wound along the ulnar aspect of the distal forearm I did not manipulate the left forearm nor did I evaluate the wound as patient is currently comfortable Radial, ulnar,  median nerve motor and sensory functions appear to be grossly intact although patient does have pain with digit extension AIN and PIN appear to be grossly intact as well Extremity is warm + Radial pulse Swelling minimal  Elbow, upper arm and shoulder girdle are nontender    Skin:    General: Skin is warm.  Neurological:  General: No focal deficit present.     Mental Status: She is alert and oriented to person, place, and time.  Psychiatric:        Attention and Perception: Attention normal.        Mood and Affect: Mood and affect normal.        Speech: Speech normal.        Behavior: Behavior is cooperative.        Cognition and Memory: Cognition normal.     Assessment/Plan:  55 year old right-hand-dominant female with open fracture left distal forearm  -Open fracture left distal forearm, both bone  OR today for I&D and ORIF  Admit after surgery for IV abx   Likely home tomorrow   - Pain management:  Multimodal   - ID:   Ancef   - FEN/GI prophylaxis/Foley/Lines:  NPO   Advance diet post op   - Impediments to fracture healing:  Open fracture   - Dispo:  OR emergently for I&D and ORIF L forearm     Mearl Latin, PA-C [phone] (C) 02/22/2021, 12:51 PM  [facility] [address] [place] [address] [phone] [phone] (F)    After 5pm and on the weekends please log on to Amion, go to orthopaedics and the look under the [facility] Call for the provider(s) on call. You can also call our office at [phone] and then follow the prompts to be connected to the call team.

## 2021-02-22 NOTE — ED Triage Notes (Signed)
Pt here with an open left arm fx after falling while running , 2gm ancef 100 fentanyl given by ems

## 2021-02-23 ENCOUNTER — Ambulatory Visit (HOSPITAL_COMMUNITY): Payer: BLUE CROSS/BLUE SHIELD

## 2021-02-23 LAB — CBC
HCT: 40.4 % (ref 36.0–46.0)
Hemoglobin: 13.3 g/dL (ref 12.0–15.0)
MCH: 29.1 pg (ref 26.0–34.0)
MCHC: 32.9 g/dL (ref 30.0–36.0)
MCV: 88.4 fL (ref 80.0–100.0)
Platelets: 236 10*3/uL (ref 150–400)
RBC: 4.57 MIL/uL (ref 3.87–5.11)
RDW: 13.7 % (ref 11.5–15.5)
WBC: 9.3 10*3/uL (ref 4.0–10.5)
nRBC: 0 % (ref 0.0–0.2)

## 2021-02-23 LAB — VITAMIN D 25 HYDROXY (VIT D DEFICIENCY, FRACTURES): Vit D, 25-Hydroxy: 30.15 ng/mL (ref 30–100)

## 2021-02-23 NOTE — Progress Notes (Signed)
Occupational Therapy Evaluation Patient Details Name: [name] MRN: [id number] DOB: 01-24-1966 Today's Date: 02/23/2021    History of Present Illness Pt is a 55 y.o. female who presented 6/11 with a L open distal forearm fx after falling while running. S/p ORIF of L radius and ulna with closed reduction of the distal radioulnar joint 6/11. PMH: asthma.   Clinical Impression   [name] was evaluated the above fall and ORIF of L distal radius fx. 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 Upon evaluation, pt is indep with all mobility with no LOB or safety concerns. Pt completed ADLs overall with mod I given compensatory techniques for WB status. Pt with reports of improved numbness and tingling, and decreased pain with ROM of L shoulder and elbow. Pt does not required further skilled OT acute services. Recommend d/c home, and follow surgeons rehab recommendations.    Follow Up Recommendations  Follow surgeon's recommendation for DC plan and follow-up therapies    Equipment Recommendations  None recommended by OT       Precautions / Restrictions Precautions Precautions: Fall (low fall risk) Precaution Comments: L forearm fx Required Braces or Orthoses: Sling;Splint/Cast Splint/Cast: ACE wrap around splint/cast of L proximal forearm to proximal hand Restrictions Weight Bearing Restrictions: Yes LUE Weight Bearing: Non weight bearing (through the wrist, WBAT through elbow)      Mobility Bed Mobility Overal bed mobility: Independent             General bed mobility comments: Pt able to perform all bed mobility safely with bed flat and no use of rails.    Transfers Overall transfer level: Independent Equipment used: None             General transfer comment: safely without LOB    Balance Overall balance assessment: No apparent balance deficits (not formally assessed)                    ADL either performed or assessed with clinical judgement   ADL Overall ADL's : Needs assistance/impaired Eating/Feeding: Independent;Sitting   Grooming: Wash/dry hands;Wash/dry face;Oral care;Applying deodorant;Supervision/safety;Sitting;Standing   Upper Body Bathing: Supervision/ safety;Sitting;Standing   Lower Body Bathing: Supervison/ safety;Sit to/from stand   Upper Body Dressing : Minimal assistance Upper Body Dressing Details (indicate cue type and reason): min A for clasping bra Lower Body Dressing: Supervision/safety;Sit to/from stand   Toilet Transfer: Modified Independent;Ambulation;Regular Toilet   Toileting- Architect and Hygiene: Independent;Sit to/from stand       Functional mobility during ADLs: Modified independent General ADL Comments: reviewed compensatroy techniques with ADLs while maintaining NWB     Vision Patient Visual Report: No change from baseline              Pertinent Vitals/Pain Pain Assessment: Faces Pain Score: 4  Faces Pain Scale: Hurts a little bit Pain Location: L wirst Pain Descriptors / Indicators: Grimacing;Guarding Pain Intervention(s): Limited activity within patient's tolerance;Monitored during session     Hand Dominance Right   Extremity/Trunk Assessment Upper Extremity Assessment Upper Extremity Assessment: RUE deficits/detail RUE Deficits / Details: pt with reports of imrpoved numbess and tingling RUE Sensation: WNL RUE Coordination: WNL LUE Deficits / Details: WFL should and albow ROM, pt with reports of imrpoved pain with movement LUE Sensation: decreased light touch LUE Coordination: decreased gross motor;decreased fine motor (slow and deliberate)   Lower Extremity Assessment Lower Extremity Assessment: Overall [facility]  for tasks assessed   Cervical / Trunk Assessment Cervical / Trunk Assessment: Normal   Communication Communication Communication: No difficulties   Cognition Arousal/Alertness:  Awake/alert Behavior During Therapy: WFL for tasks assessed/performed Overall Cognitive Status: Within Functional Limits for tasks assessed             General Comments: A&Ox4.   General Comments  reviewed precautions, restrictions and compensatory techniques for ADLs     Home Living Family/patient expects to be discharged to:: Private residence Living Arrangements: Children Available Help at Discharge: Family;Available 24 hours/day Type of Home: House Home Access: Stairs to enter [employer] of Steps: 4 Entrance Stairs-Rails: Right Home Layout: Two level;Able to live on main level with bedroom/bathroom Alternate Level Stairs-Number of Steps: flight Alternate Level Stairs-Rails: Left Bathroom Shower/Tub: Walk-in shower;Door   [employer] Toilet: Standard Bathroom Accessibility: Yes   Home Equipment: None          Prior Functioning/Environment Level of Independence: Independent        Comments: 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 Pt drives.        OT Problem List: Decreased activity tolerance;Pain;Impaired UE functional use      OT Treatment/Interventions:      OT Goals(Current goals can be found in the care plan section) Acute Rehab OT Goals Patient Stated Goal: to go home today   AM-PAC OT "6 Clicks" Daily Activity     Outcome Measure Help from another person eating meals?: None Help from another person taking care of personal grooming?: None Help from another person toileting, which includes using toliet, bedpan, or urinal?: None Help from another person bathing (including washing, rinsing, drying)?: None Help from another person to put on and taking off regular upper body clothing?: A Little Help from another person to put on and taking off regular lower body clothing?: None 6 Click Score: 23   End of Session Equipment Utilized During Treatment: Other (comment)  (sling) Nurse Communication: Mobility status  Activity Tolerance: Patient tolerated treatment well Patient left: in bed;with call bell/phone within reach;with family/visitor present  OT Visit Diagnosis: Pain Pain - Right/Left: Left Pain - part of body: Arm                Time: 6440-3474 OT Time Calculation (min): 13 min Charges:  OT General Charges $OT Visit: 1 Visit OT Evaluation $OT Eval Low Complexity: 1 Low    Amier Hoyt A Mckenleigh Tarlton 02/23/2021, 10:30 AM

## 2021-02-23 NOTE — Progress Notes (Signed)
OT Cancellation Note  Patient Details Name: [name] MRN: [id number] DOB: Sep 19, 1965   Cancelled Treatment:    Reason Eval/Treat Not Completed: Patient at procedure or test/ unavailable (Pt with PT upon my arrival, will f/u today as able.)  Lelon Mast A Matis Monnier 02/23/2021, 9:04 AM

## 2021-02-23 NOTE — Progress Notes (Signed)
AVS given and reviewed with pt. Medications discussed. All questions answered to satisfaction. Pt verbalized understanding of information given. Pt escorted off the unit with all belongings via wheelchair by staff member.  

## 2021-02-23 NOTE — Progress Notes (Signed)
    Subjective: Patient reports pain as mild.  Tolerating diet.  Urinating.   No CP, SOB.  Worked with PT/OT on mobilization OOB and splint care. Feels good. Ready to go home.  Objective:   VITALS:   Vitals:   02/22/21 2116 02/23/21 0128 02/23/21 0519 02/23/21 1000  BP: 116/70 103/61 (!) 100/57 103/63  Pulse: 61 (!) 58 (!) 54 64  Resp: 18 16 16 16   Temp: 97.8 F (36.6 C) 97.8 F (36.6 C) (!) 97.4 F (36.3 C) 98 F (36.7 C)  TempSrc: Oral Oral Oral Oral  SpO2: 99% 100% 100% 100%  Weight:      Height:       CBC Latest Ref Rng & Units 02/23/2021 02/22/2021 01/28/2010  WBC 4.0 - 10.5 K/uL 9.3 4.9 12.6(H)  Hemoglobin 12.0 - 15.0 g/dL 01/30/2010 04.5 40.9  Hematocrit 36.0 - 46.0 % 40.4 38.7 39.2  Platelets 150 - 400 K/uL 236 200 256   BMP Latest Ref Rng & Units 02/22/2021 01/31/2010 01/30/2010  Glucose 70 - 99 mg/dL 02/01/2010) - -  BUN 6 - 20 mg/dL 11 - -  Creatinine 914(N - 1.00 mg/dL 8.29 - -  Sodium 5.62 - 145 mmol/L 139 - -  Potassium 3.5 - 5.1 mmol/L 3.9 - -  Chloride 98 - 111 mmol/L 109 - -  CO2 22 - 32 mmol/L 25 - -  Calcium 8.9 - 10.3 mg/dL 9.1 8.7 9.0   Intake/Output      06/11 0701 06/12 0700 06/12 0701 06/13 0700   P.O. 360    I.V. (mL/kg) 1000 (12.7)    IV Piggyback 200    Total Intake(mL/kg) 1560 (19.9)    Urine (mL/kg/hr) 800    Blood 25    Total Output 825    Net +735         Urine Occurrence 1 x       Physical Exam: General: NAD. Sitting up in bed. Smiling, Comfortable Resp: No increased wob Cardio: regular rate and rhythm ABD soft Neurologically intact MSK Neurovascularly intact Sensation intact distally Intact pulses distally Able to move fingers Incision: dressing C/D/I, splint in place   Assessment: 1 Day Post-Op  S/P Procedure(s) (LRB): OPEN REDUCTION INTERNAL FIXATION (ORIF) DISTAL RADIAL FRACTURE (Left) by Dr. 7/13 on 02/22/21  Active Problems:   Open left forearm fracture   Plan: Advance diet Up with therapy Incentive  Spirometry Elevate and Apply ice  Weightbearing: NWB LUE Insicional and dressing care: Dressings left intact until follow-up and Reinforce dressings as needed Orthopedic device(s): Splint Showering: Keep dressing dry VTE prophylaxis: SCDs, ambulation Pain control: Tylenol, Norco Follow - up plan:  10-14 days in the office with Dr. 04/24/21 for suture and splint removal  Contact information for today:  Carola Frost MD, Margarita Rana PA-C  Dispo: Home today    Levester Fresh, Jenne Pane Office [phone] 02/23/2021, 10:34 AM

## 2021-02-23 NOTE — Discharge Summary (Signed)
Physician Discharge Summary  Patient ID: [name] MRN: [id number] DOB/AGE: [birth year] 55 y.o.  Admit date: 02/22/2021 Discharge date: 02/23/2021  Admission Diagnoses: L forearm fracture  Discharge Diagnoses:  Active Problems:   Open left forearm fracture   Discharged Condition: good  Hospital Course: Patient under went an ORIF L forearm on 02/22/21 by Dr. Carola Frost with no complications. She was placed in a splint and is ready for d/c home.   Consults: None  Significant Diagnostic Studies: none  Treatments: IV hydration, antibiotics: Ancef, analgesia: acetaminophen and Vicodin, and surgery: ORIF L forearm  Discharge Exam: Blood pressure 103/63, pulse 64, temperature 98 F (36.7 C), temperature source Oral, resp. rate 16, height 5\' 6"  (1.676 m), weight 78.5 kg, last menstrual period 05/29/2020, SpO2 100 %. General appearance: alert, cooperative, and no distress Head: Normocephalic, without obvious abnormality, atraumatic Eyes: conjunctivae/corneas clear. PERRL, EOM's intact. Fundi benign. Resp: clear to auscultation bilaterally Cardio: regular rate and rhythm, S1, S2 normal, no murmur, click, rub or gallop GI: soft, non-tender; bowel sounds normal; no masses,  no organomegaly Extremities: extremities normal, atraumatic, no cyanosis or edema Pulses:  L brachial 2+ R brachial 2+  L radial 2+ R radial 2+  L inguinal 2+ R inguinal 2+  L popliteal 2+ R popliteal 2+  L posterior tibial 2+ R posterior tibial 2+  L dorsalis pedis 2+ R dorsalis pedis 2+   Neurologic: Alert and oriented X 3, normal strength and tone. Normal symmetric reflexes. Normal coordination and gait Incision/Wound: dressings c/d/I, splint in place  Disposition: Discharge disposition: 01-Home or Self Care        Allergies as of 02/23/2021   No Known Allergies      Medication List     TAKE these medications    acetaminophen 500 MG tablet Commonly known as: TYLENOL Take 1 tablet (500 mg total)  by mouth every 8 (eight) hours.   Advair HFA 115-21 MCG/ACT inhaler Generic drug: fluticasone-salmeterol Inhale 2 puffs into the lungs 2 (two) times daily.   docusate sodium 100 MG capsule Commonly known as: COLACE Take 1 capsule (100 mg total) by mouth 2 (two) times daily.   HYDROcodone-acetaminophen 5-325 MG tablet Commonly known as: NORCO/VICODIN Take 1-2 tablets by mouth every 8 (eight) hours as needed for moderate pain or severe pain.   levothyroxine 137 MCG tablet Commonly known as: SYNTHROID Take 1 tablet (137 mcg total) by mouth daily before breakfast.   methocarbamol 500 MG tablet Commonly known as: ROBAXIN Take 1-2 tablets (500-1,000 mg total) by mouth every 6 (six) hours as needed for muscle spasms.        Follow-up Information     04/25/2021, MD. Schedule an appointment as soon as possible for a visit in 2 week(s).   Specialty: Orthopedic Surgery Contact information: [address] [place] [phone]                 Signed: [phone] PA-C 02/23/2021, 11:40 AM

## 2021-02-23 NOTE — Evaluation (Signed)
Physical Therapy Evaluation Patient Details Name: [name] MRN: [id number] DOB: Mar 28, 1966 Today's Date: 02/23/2021   History of Present Illness  Pt is a 55 y.o. female who presented 6/11 with a L open distal forearm fx after falling while running. S/p ORIF of L radius and ulna with closed reduction of the distal radioulnar joint 6/11. PMH: asthma.   Clinical Impression  Pt presents with condition above and deficits mentioned below, see PT Problem List. PTA, she was living in a 2-level house with her son and functioning independently. Currently, pt demonstrates WNL balance and gait pattern and does not appear to be at risk for subsequent falls. She does display tingling sensations in her bil fingers (worse in her L with noted edema likely causing sensation deficits s/p ORIF), L UE weakness throughout, and L UE coordination deficits. Pt tested positive for multiple L rotator cuff special tests with an inability to move her shoulder in all planes > ~15-30 degrees and with pt having tenderness to palpation at her L greater and lesser tubercles, RN and ortho MD notified. Recommend follow-up with Outpatient PT services to address her L UE deficits. Will continue to follow acutely. Educated pt on proper donning of L UE sling, WB precautions, and L UE exercises and neck stretches.     Follow Up Recommendations Outpatient PT    Equipment Recommendations  None recommended by PT    Recommendations for Other Services       Precautions / Restrictions Precautions Precaution Comments: L forearm fx Required Braces or Orthoses: Sling;Splint/Cast Splint/Cast: ACE wrap around splint/cast of L proximal forearm to proximal hand Restrictions Weight Bearing Restrictions: Yes LUE Weight Bearing: Non weight bearing (through wrist)      Mobility  Bed Mobility Overal bed mobility: Independent             General bed mobility comments: Pt able to perform all bed mobility safely with bed flat and  no use of rails.    Transfers Overall transfer level: Independent Equipment used: None             General transfer comment: Pt able to come to stand safely without LOB.  Ambulation/Gait Ambulation/Gait assistance: Supervision Gait Distance (Feet): 350 Feet Assistive device: None Gait Pattern/deviations: WFL(Within Functional Limits) Gait velocity: WNL Gait velocity interpretation: >4.37 ft/sec, indicative of normal walking speed General Gait Details: No abnormal gait deviations noted and no LOB, supervision for safety. Pt able to change gait speeds, quickly stop, and change head positions safely.  Stairs Stairs: Yes Stairs assistance: Supervision Stair Management: One rail Right;No rails;Alternating pattern;Forwards Number of Stairs: 20 General stair comments: Ascends without rail and descends with R rail, displaying reciprocal gait pattern. No LOB, supervision for safety.  Wheelchair Mobility    Modified Rankin (Stroke Patients Only)       Balance Overall balance assessment: No apparent balance deficits (not formally assessed)                                           Pertinent Vitals/Pain Pain Assessment: 0-10 Pain Score: 4  Pain Location: L wirst Pain Descriptors / Indicators: Guarding;Other (Comment) ("intense") Pain Intervention(s): Limited activity within patient's tolerance;Monitored during session;Repositioned    Home Living Family/patient expects to be discharged to:: Private residence Living Arrangements: Children (2 y.o. 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 Pt drives.     Hand Dominance   Dominant Hand: Right    Extremity/Trunk Assessment   Upper Extremity Assessment Upper Extremity Assessment: RUE deficits/detail;LUE deficits/detail RUE Deficits / Details: Tingling noted in finger tips since fall, exacerbated by raising UE above head, pt believes it is from scraping them on the pavement; WNL strength throughout, no tenderness with palpation throughout RUE Sensation:  ("tingling" in finger tips) RUE Coordination: WNL LUE Deficits / Details: Positive infraspinatus, supraspinatus, and drop arm/sign tests with tenderness to palpation at greater and lesser tubercles, RN and ortho MD notified; generalized weakness throughout; edema and numbness/tingling in fingers noted LUE Sensation: decreased light touch (fingers) LUE Coordination: decreased gross motor;decreased fine motor    Lower Extremity Assessment Lower Extremity Assessment: Overall WFL for tasks assessed    Cervical / Trunk Assessment Cervical / Trunk Assessment: Normal  Communication   Communication: No difficulties  Cognition Arousal/Alertness: Awake/alert Behavior During Therapy: WFL for tasks assessed/performed Overall Cognitive Status: Within Functional Limits for tasks assessed                                 General Comments: A&Ox4.      General Comments General comments (skin integrity, edema, etc.): Educated pt on self stretching L upper traps and levator scap, donning of sling and usage, elevation of L UE, NWB through L wrist only, and performing ROM and using L shoulder, elbow, and fingers    Exercises     Assessment/Plan    PT Assessment Patient needs continued PT services  PT Problem List Decreased strength;Decreased range of motion;Decreased coordination;Impaired sensation;Pain       PT Treatment Interventions DME instruction;Gait training;Stair training;Functional mobility  training;Therapeutic activities;Therapeutic exercise;Neuromuscular re-education;Patient/family education    PT Goals (Current goals can be found in the Care Plan section)  Acute Rehab PT Goals Patient Stated Goal: to go home PT Goal Formulation: With patient Time For Goal Achievement: 03/02/21 Potential to Achieve Goals: Good    Frequency Min 5X/week   Barriers to discharge        Co-evaluation               AM-PAC PT "6 Clicks" Mobility  Outcome Measure Help needed turning from your back to your side while in a flat bed without using bedrails?: None Help needed moving from lying on your back to sitting on the side of a flat bed without using bedrails?: None Help needed moving to and from a bed to a chair (including a wheelchair)?: None Help needed standing up from a chair using your arms (e.g., wheelchair or bedside chair)?: None Help needed to walk in hospital room?: A Little Help needed climbing 3-5 steps with a railing? : A Little 6 Click Score: 22    End of Session Equipment Utilized During Treatment: Other (comment) (sling) Activity Tolerance: Patient tolerated treatment well Patient left: in bed;with call bell/phone within reach Nurse Communication: Mobility status;Other (comment) (rotator cuff symptoms at L shoulder, no need for bed alarm) PT Visit Diagnosis: Muscle weakness (generalized) (M62.81);Pain Pain -  Right/Left: Left Pain - part of body: Arm    Time: 9417-4081 PT Time Calculation (min) (ACUTE ONLY): 27 min   Charges:   PT Evaluation $PT Eval Low Complexity: 1 Low PT Treatments $Therapeutic Activity: 8-22 mins        Raymond Gurney, PT, DPT Acute Rehabilitation Services  Pager: [phone] Office: [phone]   [name] 02/23/2021, 8:24 AM

## 2021-02-23 NOTE — Plan of Care (Signed)
  Problem: Education: Goal: Knowledge of General Education information will improve Description: Including pain rating scale, medication(s)/side effects and non-pharmacologic comfort measures 02/23/2021 0843 by Merrilyn Puma, RN Outcome: Progressing 02/23/2021 0843 by Merrilyn Puma, RN Outcome: Progressing   Problem: Health Behavior/Discharge Planning: Goal: Ability to manage health-related needs will improve 02/23/2021 0843 by Merrilyn Puma, RN Outcome: Progressing 02/23/2021 0843 by Merrilyn Puma, RN Outcome: Progressing   Problem: Clinical Measurements: Goal: Ability to maintain clinical measurements within normal limits will improve 02/23/2021 0843 by Merrilyn Puma, RN Outcome: Progressing 02/23/2021 0843 by Merrilyn Puma, RN Outcome: Progressing Goal: Diagnostic test results will improve 02/23/2021 0843 by Merrilyn Puma, RN Outcome: Progressing 02/23/2021 0843 by Merrilyn Puma, RN Outcome: Progressing Goal: Respiratory complications will improve 02/23/2021 0843 by Merrilyn Puma, RN Outcome: Progressing 02/23/2021 0843 by Merrilyn Puma, RN Outcome: Progressing   Problem: Activity: Goal: Risk for activity intolerance will decrease 02/23/2021 0843 by Merrilyn Puma, RN Outcome: Progressing 02/23/2021 0843 by Merrilyn Puma, RN Outcome: Progressing   Problem: Nutrition: Goal: Adequate nutrition will be maintained 02/23/2021 0843 by Merrilyn Puma, RN Outcome: Progressing 02/23/2021 0843 by Merrilyn Puma, RN Outcome: Progressing   Problem: Elimination: Goal: Will not experience complications related to bowel motility 02/23/2021 0843 by Merrilyn Puma, RN Outcome: Progressing 02/23/2021 0843 by Merrilyn Puma, RN Outcome: Progressing Goal: Will not experience complications related to urinary retention 02/23/2021 0843 by Merrilyn Puma, RN Outcome: Progressing 02/23/2021 0843 by Merrilyn Puma, RN Outcome:  Progressing   Problem: Pain Managment: Goal: General experience of comfort will improve 02/23/2021 0843 by Merrilyn Puma, RN Outcome: Progressing 02/23/2021 0843 by Merrilyn Puma, RN Outcome: Progressing   Problem: Safety: Goal: Ability to remain free from injury will improve 02/23/2021 0843 by Merrilyn Puma, RN Outcome: Progressing 02/23/2021 0843 by Merrilyn Puma, RN Outcome: Progressing   Problem: Skin Integrity: Goal: Risk for impaired skin integrity will decrease 02/23/2021 0843 by Merrilyn Puma, RN Outcome: Progressing 02/23/2021 0843 by Merrilyn Puma, RN Outcome: Progressing   Problem: Activity: Goal: Ability to avoid complications of mobility impairment will improve Outcome: Progressing Goal: Ability to tolerate increased activity will improve Outcome: Progressing   Problem: Education: Goal: Verbalization of understanding the information provided will improve Outcome: Progressing   Problem: Physical Regulation: Goal: Postoperative complications will be avoided or minimized Outcome: Progressing   Problem: Respiratory: Goal: Ability to maintain a clear airway will improve Outcome: Progressing   Problem: Pain Management: Goal: Pain level will decrease Outcome: Progressing   Problem: Skin Integrity: Goal: Signs of wound healing will improve Outcome: Progressing   Problem: Tissue Perfusion: Goal: Ability to maintain adequate tissue perfusion will improve Outcome: Progressing

## 2021-02-24 LAB — POCT PREGNANCY, URINE: Preg Test, Ur: NEGATIVE

## 2021-02-25 ENCOUNTER — Encounter (HOSPITAL_COMMUNITY): Payer: Self-pay | Admitting: Orthopedic Surgery

## 2022-07-14 DIAGNOSIS — Z1231 Encounter for screening mammogram for malignant neoplasm of breast: Secondary | ICD-10-CM | POA: Diagnosis not present

## 2022-07-15 DIAGNOSIS — Z419 Encounter for procedure for purposes other than remedying health state, unspecified: Secondary | ICD-10-CM | POA: Diagnosis not present

## 2022-08-14 DIAGNOSIS — Z419 Encounter for procedure for purposes other than remedying health state, unspecified: Secondary | ICD-10-CM | POA: Diagnosis not present

## 2022-09-14 DIAGNOSIS — Z419 Encounter for procedure for purposes other than remedying health state, unspecified: Secondary | ICD-10-CM | POA: Diagnosis not present

## 2022-10-15 DIAGNOSIS — Z419 Encounter for procedure for purposes other than remedying health state, unspecified: Secondary | ICD-10-CM | POA: Diagnosis not present

## 2022-11-13 DIAGNOSIS — Z419 Encounter for procedure for purposes other than remedying health state, unspecified: Secondary | ICD-10-CM | POA: Diagnosis not present

## 2022-12-14 DIAGNOSIS — Z419 Encounter for procedure for purposes other than remedying health state, unspecified: Secondary | ICD-10-CM | POA: Diagnosis not present

## 2023-01-13 DIAGNOSIS — Z419 Encounter for procedure for purposes other than remedying health state, unspecified: Secondary | ICD-10-CM | POA: Diagnosis not present

## 2023-02-13 DIAGNOSIS — Z419 Encounter for procedure for purposes other than remedying health state, unspecified: Secondary | ICD-10-CM | POA: Diagnosis not present

## 2023-03-03 DIAGNOSIS — K5730 Diverticulosis of large intestine without perforation or abscess without bleeding: Secondary | ICD-10-CM | POA: Diagnosis not present

## 2023-03-03 DIAGNOSIS — K640 First degree hemorrhoids: Secondary | ICD-10-CM | POA: Diagnosis not present

## 2023-03-03 DIAGNOSIS — K6389 Other specified diseases of intestine: Secondary | ICD-10-CM | POA: Diagnosis not present

## 2023-03-03 DIAGNOSIS — Z1211 Encounter for screening for malignant neoplasm of colon: Secondary | ICD-10-CM | POA: Diagnosis not present

## 2023-03-15 DIAGNOSIS — Z419 Encounter for procedure for purposes other than remedying health state, unspecified: Secondary | ICD-10-CM | POA: Diagnosis not present

## 2023-04-15 DIAGNOSIS — Z419 Encounter for procedure for purposes other than remedying health state, unspecified: Secondary | ICD-10-CM | POA: Diagnosis not present

## 2023-05-16 DIAGNOSIS — Z419 Encounter for procedure for purposes other than remedying health state, unspecified: Secondary | ICD-10-CM | POA: Diagnosis not present

## 2023-06-15 DIAGNOSIS — Z419 Encounter for procedure for purposes other than remedying health state, unspecified: Secondary | ICD-10-CM | POA: Diagnosis not present

## 2023-07-16 DIAGNOSIS — Z419 Encounter for procedure for purposes other than remedying health state, unspecified: Secondary | ICD-10-CM | POA: Diagnosis not present

## 2023-08-15 DIAGNOSIS — Z419 Encounter for procedure for purposes other than remedying health state, unspecified: Secondary | ICD-10-CM | POA: Diagnosis not present

## 2023-09-15 DIAGNOSIS — Z419 Encounter for procedure for purposes other than remedying health state, unspecified: Secondary | ICD-10-CM | POA: Diagnosis not present

## 2023-10-16 DIAGNOSIS — Z419 Encounter for procedure for purposes other than remedying health state, unspecified: Secondary | ICD-10-CM | POA: Diagnosis not present

## 2023-11-10 DIAGNOSIS — Z23 Encounter for immunization: Secondary | ICD-10-CM | POA: Diagnosis not present

## 2023-11-10 DIAGNOSIS — M79644 Pain in right finger(s): Secondary | ICD-10-CM | POA: Diagnosis not present

## 2023-11-10 DIAGNOSIS — S62636A Displaced fracture of distal phalanx of right little finger, initial encounter for closed fracture: Secondary | ICD-10-CM | POA: Diagnosis not present

## 2023-11-10 DIAGNOSIS — Z0000 Encounter for general adult medical examination without abnormal findings: Secondary | ICD-10-CM | POA: Diagnosis not present

## 2023-11-11 DIAGNOSIS — Z13220 Encounter for screening for lipoid disorders: Secondary | ICD-10-CM | POA: Diagnosis not present

## 2023-11-11 DIAGNOSIS — Z0000 Encounter for general adult medical examination without abnormal findings: Secondary | ICD-10-CM | POA: Diagnosis not present

## 2023-11-11 DIAGNOSIS — Z131 Encounter for screening for diabetes mellitus: Secondary | ICD-10-CM | POA: Diagnosis not present

## 2023-11-11 DIAGNOSIS — E890 Postprocedural hypothyroidism: Secondary | ICD-10-CM | POA: Diagnosis not present

## 2023-11-13 DIAGNOSIS — Z419 Encounter for procedure for purposes other than remedying health state, unspecified: Secondary | ICD-10-CM | POA: Diagnosis not present

## 2023-11-19 DIAGNOSIS — Z1231 Encounter for screening mammogram for malignant neoplasm of breast: Secondary | ICD-10-CM | POA: Diagnosis not present

## 2023-12-03 DIAGNOSIS — M20011 Mallet finger of right finger(s): Secondary | ICD-10-CM | POA: Diagnosis not present

## 2023-12-08 DIAGNOSIS — M25641 Stiffness of right hand, not elsewhere classified: Secondary | ICD-10-CM | POA: Diagnosis not present

## 2023-12-24 DIAGNOSIS — M25641 Stiffness of right hand, not elsewhere classified: Secondary | ICD-10-CM | POA: Diagnosis not present

## 2023-12-25 DIAGNOSIS — Z419 Encounter for procedure for purposes other than remedying health state, unspecified: Secondary | ICD-10-CM | POA: Diagnosis not present

## 2024-01-03 DIAGNOSIS — M25641 Stiffness of right hand, not elsewhere classified: Secondary | ICD-10-CM | POA: Diagnosis not present

## 2024-01-14 DIAGNOSIS — M25641 Stiffness of right hand, not elsewhere classified: Secondary | ICD-10-CM | POA: Diagnosis not present

## 2024-01-24 DIAGNOSIS — Z419 Encounter for procedure for purposes other than remedying health state, unspecified: Secondary | ICD-10-CM | POA: Diagnosis not present

## 2024-01-28 DIAGNOSIS — M25641 Stiffness of right hand, not elsewhere classified: Secondary | ICD-10-CM | POA: Diagnosis not present

## 2024-02-24 DIAGNOSIS — Z419 Encounter for procedure for purposes other than remedying health state, unspecified: Secondary | ICD-10-CM | POA: Diagnosis not present

## 2024-03-25 DIAGNOSIS — Z419 Encounter for procedure for purposes other than remedying health state, unspecified: Secondary | ICD-10-CM | POA: Diagnosis not present

## 2024-04-25 DIAGNOSIS — Z419 Encounter for procedure for purposes other than remedying health state, unspecified: Secondary | ICD-10-CM | POA: Diagnosis not present

## 2024-05-26 DIAGNOSIS — Z419 Encounter for procedure for purposes other than remedying health state, unspecified: Secondary | ICD-10-CM | POA: Diagnosis not present

## 2024-06-25 DIAGNOSIS — Z419 Encounter for procedure for purposes other than remedying health state, unspecified: Secondary | ICD-10-CM | POA: Diagnosis not present

## 2024-07-26 DIAGNOSIS — Z419 Encounter for procedure for purposes other than remedying health state, unspecified: Secondary | ICD-10-CM | POA: Diagnosis not present
